# Patient Record
Sex: Female | Born: 1935 | Race: White | Hispanic: No
Health system: Southern US, Community
[De-identification: ages and names within clinical notes are randomized; demographics above are authoritative.]

---

## 2011-10-30 ENCOUNTER — Ambulatory Visit: Payer: Self-pay | Admitting: Pain Medicine

## 2012-04-23 ENCOUNTER — Ambulatory Visit: Payer: Self-pay | Admitting: Internal Medicine

## 2012-07-06 ENCOUNTER — Ambulatory Visit: Payer: Self-pay | Admitting: Internal Medicine

## 2012-07-29 ENCOUNTER — Ambulatory Visit: Payer: Self-pay | Admitting: Internal Medicine

## 2012-08-25 ENCOUNTER — Ambulatory Visit: Payer: Self-pay | Admitting: Specialist

## 2013-01-05 ENCOUNTER — Emergency Department: Payer: Self-pay | Admitting: Emergency Medicine

## 2013-01-12 ENCOUNTER — Ambulatory Visit: Payer: Self-pay | Admitting: Family Medicine

## 2013-05-18 IMAGING — CR DG THORACIC SPINE 2-3V
1 series · 3 of 3 positions shown · non-contrast
Comparison: none

REASON FOR EXAM: generalized myalgias/mid back pain /low back pain
COMMENTS:

[Series 1: t thoracic spine ap · 0.14mm/px · 3 of 3 slices shown]
[im 1/3]
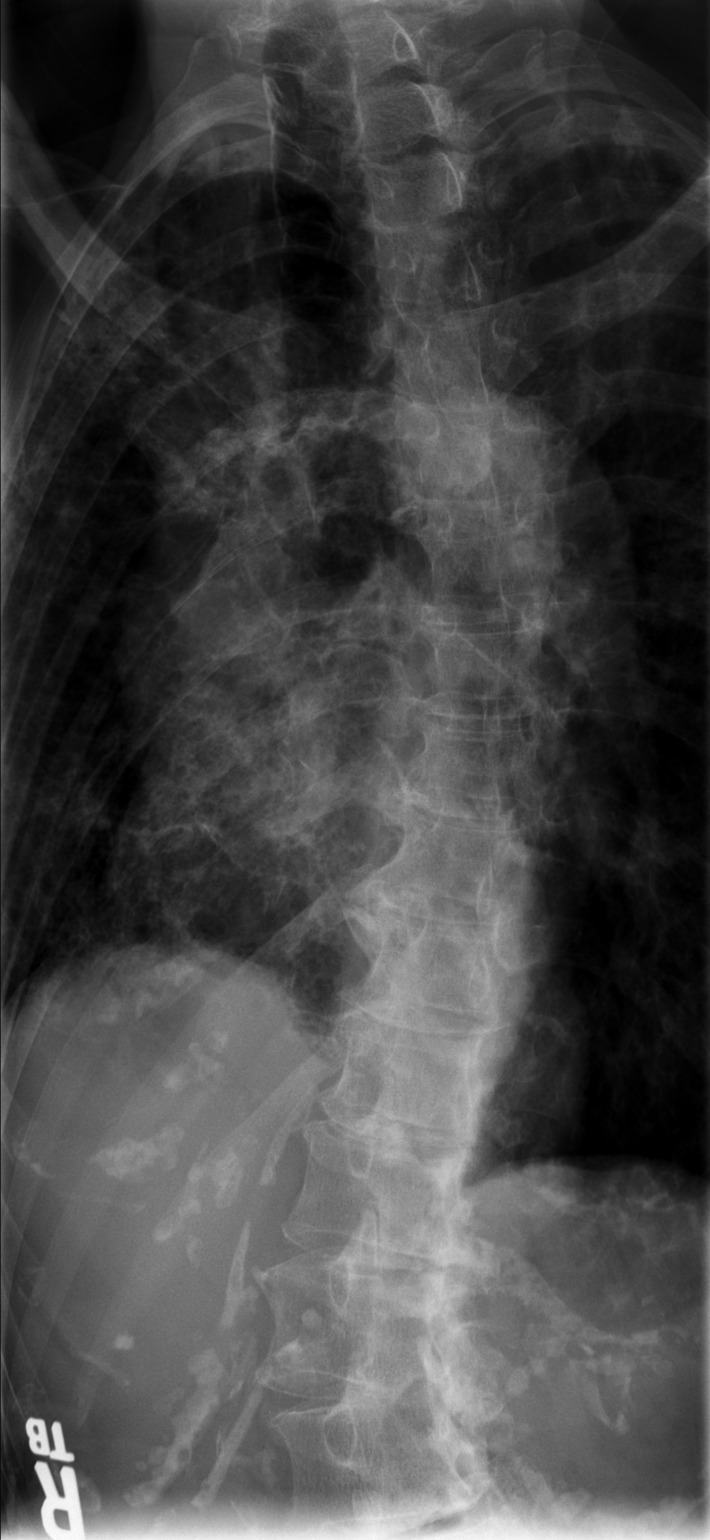
[im 2/3]
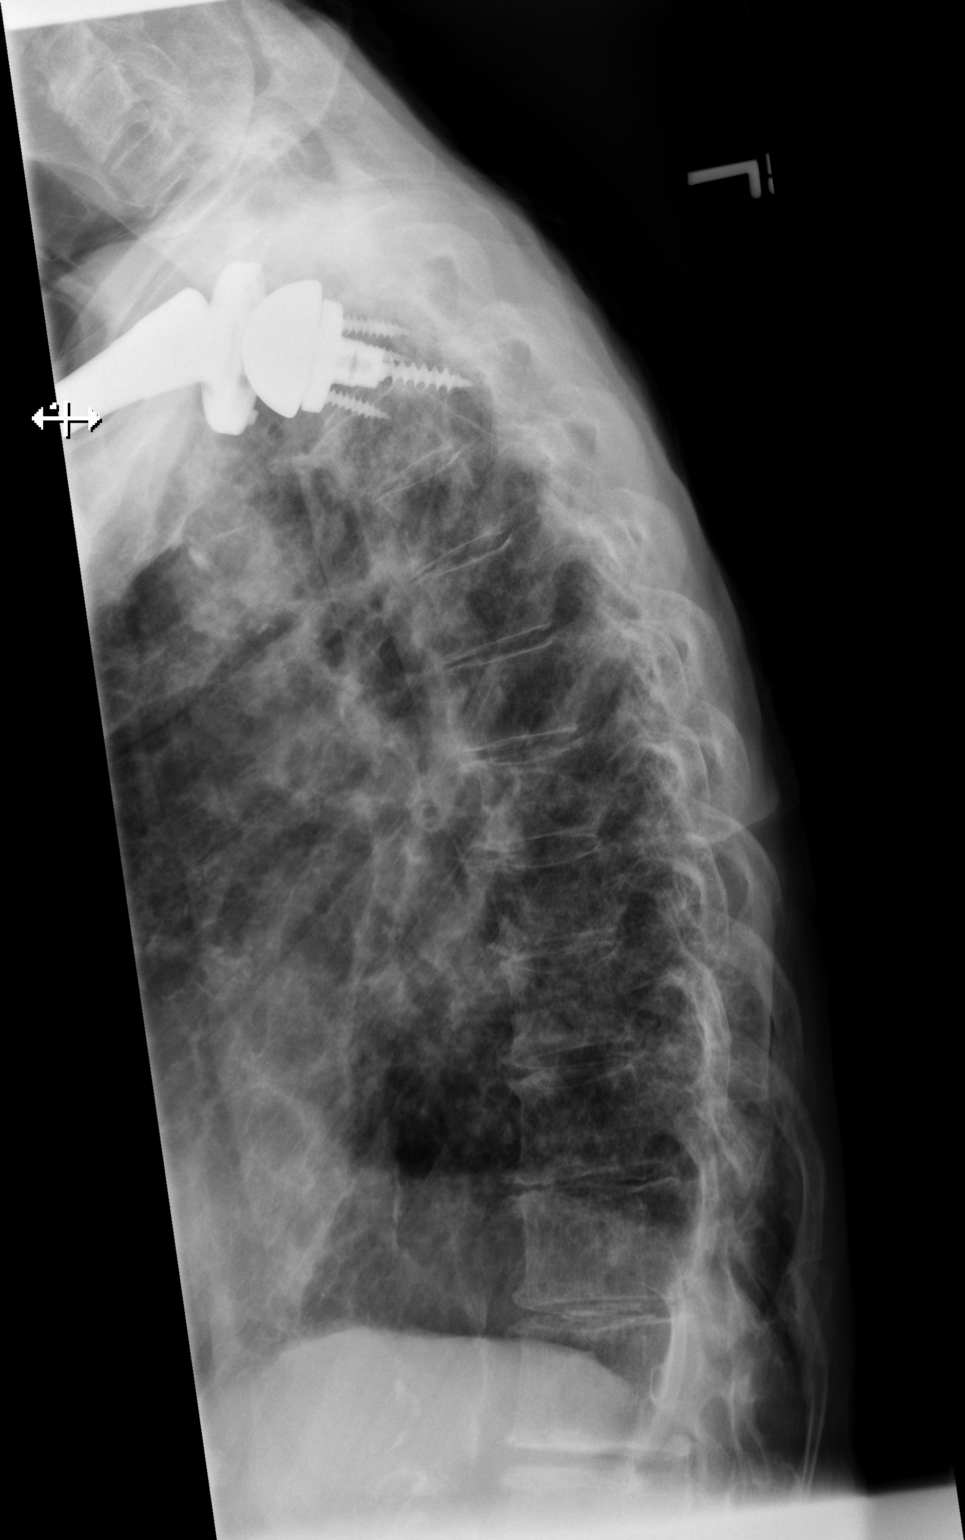
[im 3/3]
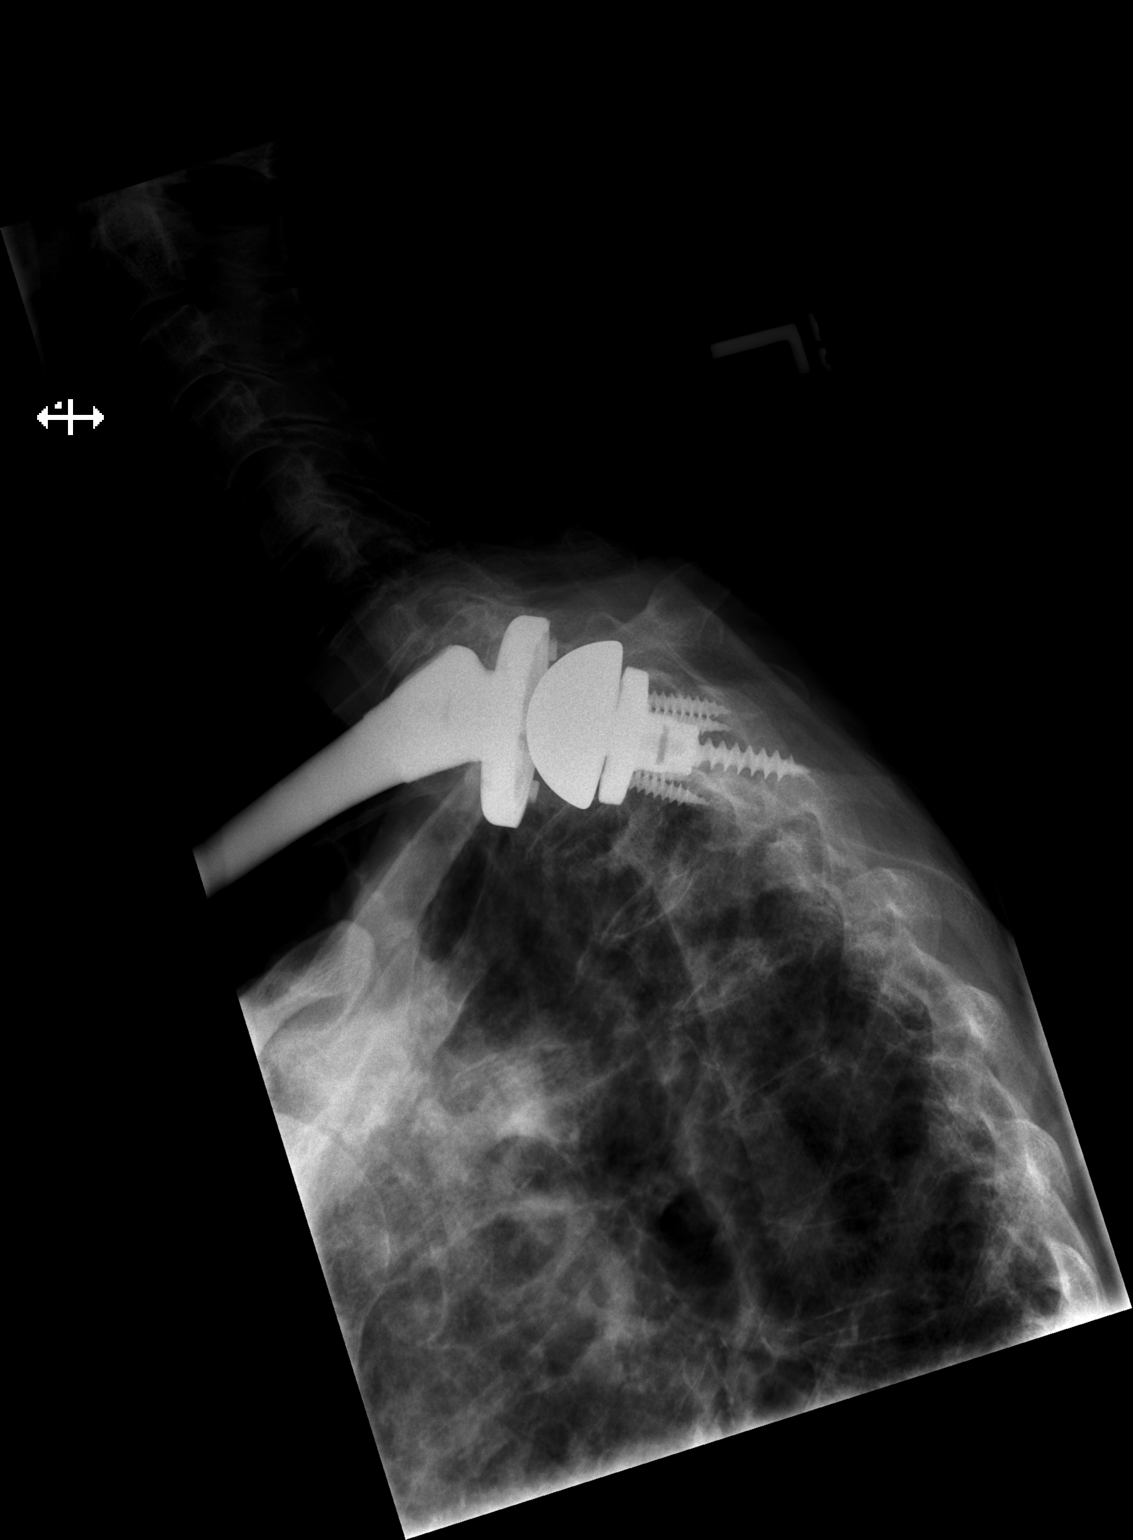

[3 of 3 positions shown; findings below may reference images not displayed]

PROCEDURE:     DXR - DXR THORACIC  AP AND LATERAL  - October 30, 2011  [DATE]

RESULT:     The vertebral body heights are well-maintained. No fracture is
seen. Vertebral body alignment is normal. There is a mild thoracolumbar
scoliosis with the thoracic component having the convexity to the left and
the lumbar component having the convexity to the right. No lytic or blastic
lesions are seen.
IMPRESSION: 1. No acute bony abnormalities are identified.
2. There is a mild thoracolumbar scoliosis as noted above.

## 2013-09-13 ENCOUNTER — Ambulatory Visit: Payer: Self-pay

## 2013-10-21 ENCOUNTER — Ambulatory Visit: Payer: Self-pay | Admitting: Gastroenterology

## 2014-01-19 ENCOUNTER — Ambulatory Visit: Payer: Self-pay | Admitting: Internal Medicine

## 2014-01-30 LAB — CBC
HCT: 38.3 % (ref 35.0–47.0)
HGB: 12.1 g/dL (ref 12.0–16.0)
MCH: 29.1 pg (ref 26.0–34.0)
MCHC: 31.5 g/dL — AB (ref 32.0–36.0)
MCV: 92 fL (ref 80–100)
PLATELETS: 294 10*3/uL (ref 150–440)
RBC: 4.15 10*6/uL (ref 3.80–5.20)
RDW: 13.2 % (ref 11.5–14.5)
WBC: 8.8 10*3/uL (ref 3.6–11.0)

## 2014-01-30 LAB — COMPREHENSIVE METABOLIC PANEL
ALK PHOS: 97 U/L
ALT: 25 U/L (ref 12–78)
AST: 29 U/L (ref 15–37)
Albumin: 2.7 g/dL — ABNORMAL LOW (ref 3.4–5.0)
Anion Gap: 3 — ABNORMAL LOW (ref 7–16)
BILIRUBIN TOTAL: 0.2 mg/dL (ref 0.2–1.0)
BUN: 20 mg/dL — ABNORMAL HIGH (ref 7–18)
CO2: 36 mmol/L — AB (ref 21–32)
Calcium, Total: 8.5 mg/dL (ref 8.5–10.1)
Chloride: 102 mmol/L (ref 98–107)
Creatinine: 0.98 mg/dL (ref 0.60–1.30)
EGFR (African American): >60
EGFR (Non-African Amer.): 54 — ABNORMAL LOW
GLUCOSE: 100 mg/dL — AB (ref 65–99)
Osmolality: 284 (ref 275–301)
Potassium: 4.5 mmol/L (ref 3.5–5.1)
SODIUM: 141 mmol/L (ref 136–145)
Total Protein: 7.3 g/dL (ref 6.4–8.2)

## 2014-01-30 LAB — URINALYSIS, COMPLETE
BACTERIA: NONE SEEN
BILIRUBIN, UR: NEGATIVE
Blood: NEGATIVE
GLUCOSE, UR: NEGATIVE mg/dL (ref 0–75)
KETONE: NEGATIVE
Leukocyte Esterase: NEGATIVE
Nitrite: NEGATIVE
Ph: 5 (ref 4.5–8.0)
Protein: 30
RBC,UR: 5 /HPF (ref 0–5)
Specific Gravity: 1.018 (ref 1.003–1.030)
Squamous Epithelial: NONE SEEN

## 2014-01-30 LAB — CK TOTAL AND CKMB (NOT AT ARMC)
CK, TOTAL: 24 U/L — AB
CK, TOTAL: 19 U/L — AB
CK-MB: 1.8 ng/mL (ref 0.5–3.6)
CK-MB: 1.8 ng/mL (ref 0.5–3.6)

## 2014-01-30 LAB — TSH: Thyroid Stimulating Horm: 3.32 u[IU]/mL

## 2014-01-30 LAB — PRO B NATRIURETIC PEPTIDE: B-TYPE NATIURETIC PEPTID: 12995 pg/mL — AB (ref 0–450)

## 2014-01-30 LAB — TROPONIN I
Troponin-I: 0.04 ng/mL
Troponin-I: 0.04 ng/mL
Troponin-I: 0.04 ng/mL

## 2014-01-30 LAB — MAGNESIUM: Magnesium: 1.8 mg/dL

## 2014-01-31 ENCOUNTER — Inpatient Hospital Stay: Payer: Self-pay | Admitting: Internal Medicine

## 2014-01-31 LAB — BASIC METABOLIC PANEL
ANION GAP: 3 — AB (ref 7–16)
BUN: 19 mg/dL — ABNORMAL HIGH (ref 7–18)
Calcium, Total: 8.2 mg/dL — ABNORMAL LOW (ref 8.5–10.1)
Chloride: 102 mmol/L (ref 98–107)
Co2: 37 mmol/L — ABNORMAL HIGH (ref 21–32)
Creatinine: 1.07 mg/dL (ref 0.60–1.30)
EGFR (African American): 57 — ABNORMAL LOW
EGFR (Non-African Amer.): 49 — ABNORMAL LOW
Glucose: 92 mg/dL (ref 65–99)
Osmolality: 285 (ref 275–301)
Potassium: 4.4 mmol/L (ref 3.5–5.1)
SODIUM: 142 mmol/L (ref 136–145)

## 2014-01-31 LAB — CBC WITH DIFFERENTIAL/PLATELET
BASOS PCT: 0.5 %
Basophil #: 0 10*3/uL (ref 0.0–0.1)
Eosinophil #: 0.1 10*3/uL (ref 0.0–0.7)
Eosinophil %: 1.3 %
HCT: 38.6 % (ref 35.0–47.0)
HGB: 12.3 g/dL (ref 12.0–16.0)
LYMPHS PCT: 13.4 %
Lymphocyte #: 1 10*3/uL (ref 1.0–3.6)
MCH: 30.2 pg (ref 26.0–34.0)
MCHC: 31.9 g/dL — AB (ref 32.0–36.0)
MCV: 95 fL (ref 80–100)
Monocyte #: 0.5 x10 3/mm (ref 0.2–0.9)
Monocyte %: 6.1 %
Neutrophil #: 6.2 10*3/uL (ref 1.4–6.5)
Neutrophil %: 78.7 %
Platelet: 258 10*3/uL (ref 150–440)
RBC: 4.08 10*6/uL (ref 3.80–5.20)
RDW: 13.7 % (ref 11.5–14.5)
WBC: 7.8 10*3/uL (ref 3.6–11.0)

## 2014-02-02 LAB — CBC
HCT: 32.7 % — AB (ref 35.0–47.0)
HGB: 9.9 g/dL — AB (ref 12.0–16.0)
MCH: 28.7 pg (ref 26.0–34.0)
MCHC: 30.4 g/dL — AB (ref 32.0–36.0)
MCV: 94 fL (ref 80–100)
PLATELETS: 180 10*3/uL (ref 150–440)
RBC: 3.46 10*6/uL — ABNORMAL LOW (ref 3.80–5.20)
RDW: 13.5 % (ref 11.5–14.5)
WBC: 9.7 10*3/uL (ref 3.6–11.0)

## 2014-02-02 LAB — TROPONIN I: TROPONIN-I: 0.14 ng/mL — AB

## 2014-02-02 LAB — COMPREHENSIVE METABOLIC PANEL
Albumin: 2.4 g/dL — ABNORMAL LOW (ref 3.4–5.0)
Alkaline Phosphatase: 91 U/L
Anion Gap: 5 — ABNORMAL LOW (ref 7–16)
BUN: 32 mg/dL — AB (ref 7–18)
Bilirubin,Total: 0.2 mg/dL (ref 0.2–1.0)
CALCIUM: 8.1 mg/dL — AB (ref 8.5–10.1)
CO2: 34 mmol/L — AB (ref 21–32)
Chloride: 105 mmol/L (ref 98–107)
Creatinine: 1.96 mg/dL — ABNORMAL HIGH (ref 0.60–1.30)
GFR CALC AF AMER: 27 — AB
GFR CALC NON AF AMER: 24 — AB
Glucose: 82 mg/dL (ref 65–99)
OSMOLALITY: 293 (ref 275–301)
Potassium: 4.5 mmol/L (ref 3.5–5.1)
SGOT(AST): 42 U/L — ABNORMAL HIGH (ref 15–37)
SGPT (ALT): 25 U/L (ref 12–78)
Sodium: 144 mmol/L (ref 136–145)
Total Protein: 6.3 g/dL — ABNORMAL LOW (ref 6.4–8.2)

## 2014-02-02 LAB — CK TOTAL AND CKMB (NOT AT ARMC)
CK, Total: 398 U/L — ABNORMAL HIGH
CK-MB: 3.4 ng/mL (ref 0.5–3.6)

## 2014-02-03 ENCOUNTER — Inpatient Hospital Stay: Payer: Self-pay | Admitting: Internal Medicine

## 2014-02-03 LAB — LIPID PANEL
Cholesterol: 137 mg/dL (ref 0–200)
HDL: 56 mg/dL (ref 40–60)
LDL CHOLESTEROL, CALC: 62 mg/dL (ref 0–100)
Triglycerides: 93 mg/dL (ref 0–200)
VLDL Cholesterol, Calc: 19 mg/dL (ref 5–40)

## 2014-02-03 LAB — BASIC METABOLIC PANEL
Anion Gap: 8 (ref 7–16)
BUN: 31 mg/dL — ABNORMAL HIGH (ref 7–18)
CALCIUM: 7.8 mg/dL — AB (ref 8.5–10.1)
CHLORIDE: 110 mmol/L — AB (ref 98–107)
CREATININE: 1.73 mg/dL — AB (ref 0.60–1.30)
Co2: 26 mmol/L (ref 21–32)
EGFR (Non-African Amer.): 27 — ABNORMAL LOW
GFR CALC AF AMER: 32 — AB
Glucose: 82 mg/dL (ref 65–99)
OSMOLALITY: 292 (ref 275–301)
POTASSIUM: 4.4 mmol/L (ref 3.5–5.1)
Sodium: 144 mmol/L (ref 136–145)

## 2014-02-03 LAB — CK-MB
CK-MB: 3.1 ng/mL (ref 0.5–3.6)
CK-MB: 2.9 ng/mL (ref 0.5–3.6)

## 2014-02-03 LAB — TROPONIN I
TROPONIN-I: 0.12 ng/mL — AB
Troponin-I: 0.16 ng/mL — ABNORMAL HIGH

## 2014-02-15 IMAGING — CT CT CHEST W/ CM
1 series · 15 of 32 positions shown, 19 images · IV contrast (agent unspecified)
Comparison: none

REASON FOR EXAM: Abn Chest Xray
COMMENTS:

PROCEDURE:     CIUNIA - CIUNIA CHEST WITH CONTRAST  - July 29, 2012  [DATE]
RESULT:
TECHNIQUE: Chest CT with 75 ml of Hsovue-7KK iodinated intravenous contrast
is reconstructed at 3 mm slice thickness in the axial plane.
There is no previous exam for comparison.

[Series 2: soft tissue · axial · 0.54mm/px · z∈[-309,+24]mm · 15 of 126 slices shown, 19 images]
[im 10/126  mediastinal]
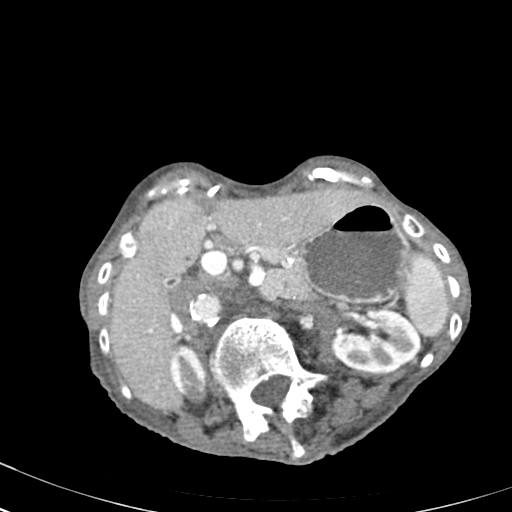
[im 10/126  lung]
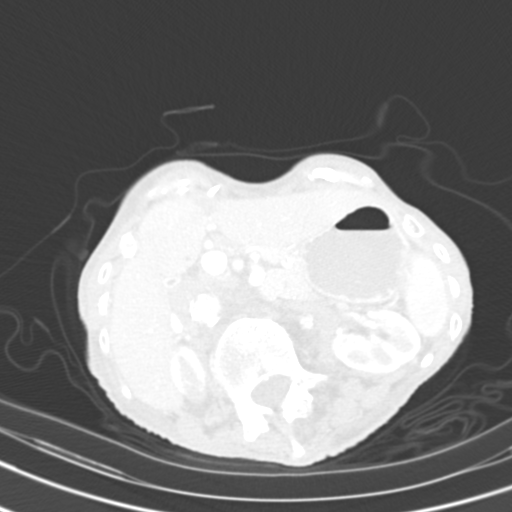
[im 19/126  lung]
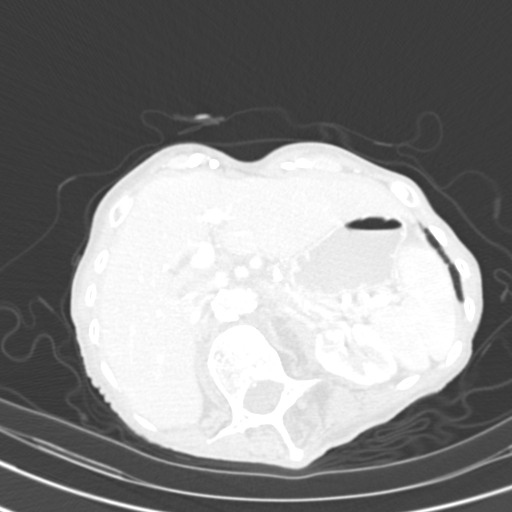
[im 26/126  lung]
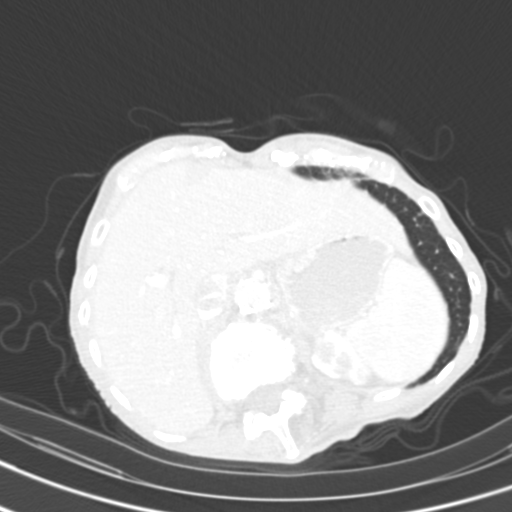
[im 33/126  lung]
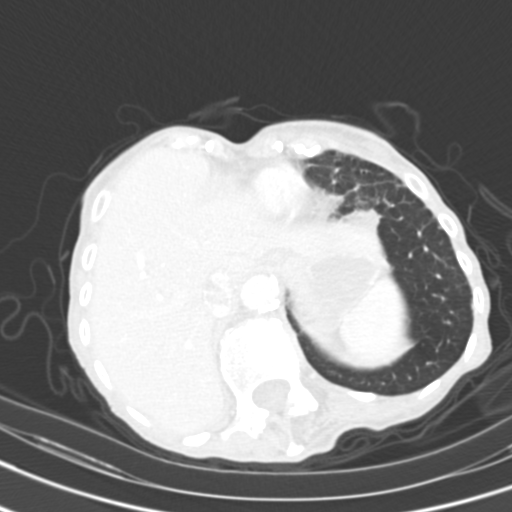
[im 42/126  mediastinal]
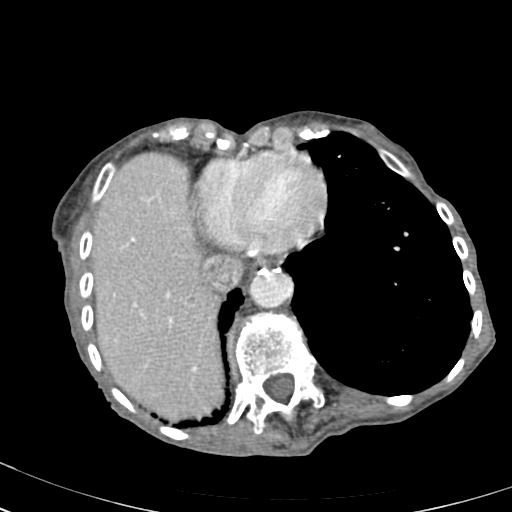
[im 42/126  lung]
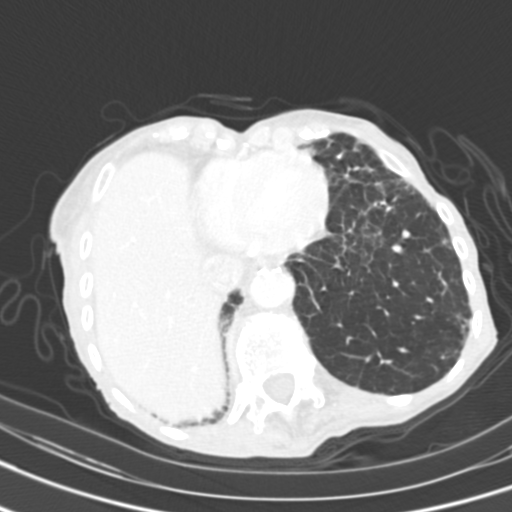
[im 51/126  lung]
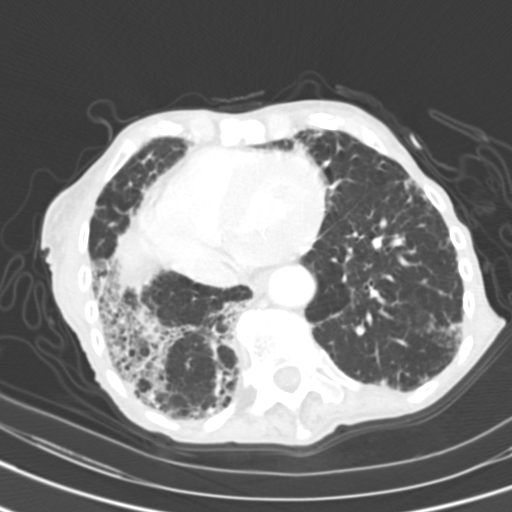
[im 61/126  lung]
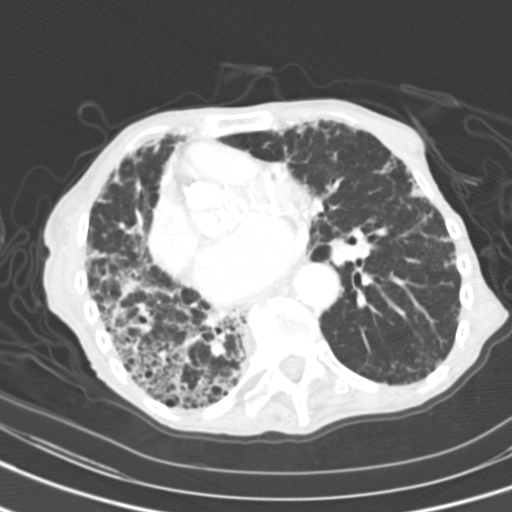
[im 66/126  lung]
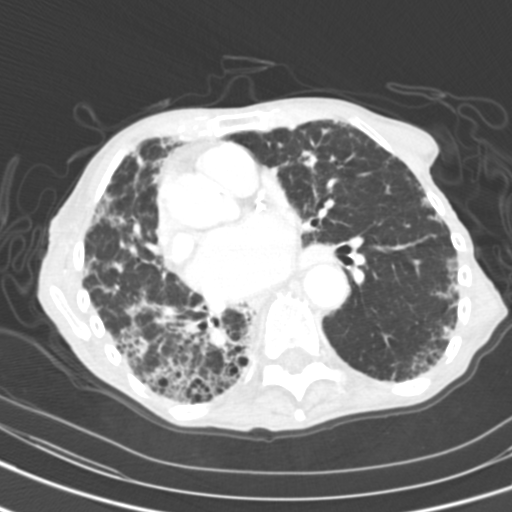
[im 75/126  mediastinal]
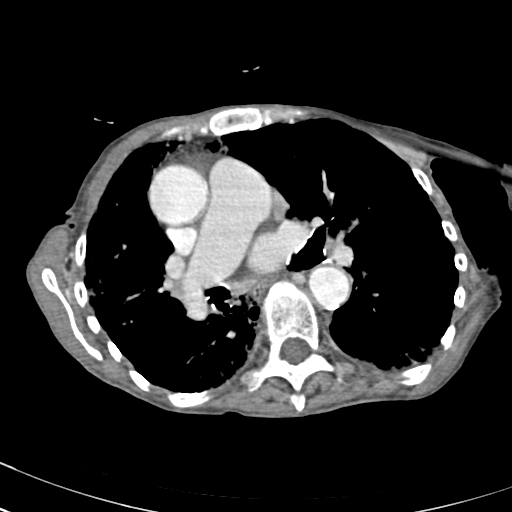
[im 75/126  lung]
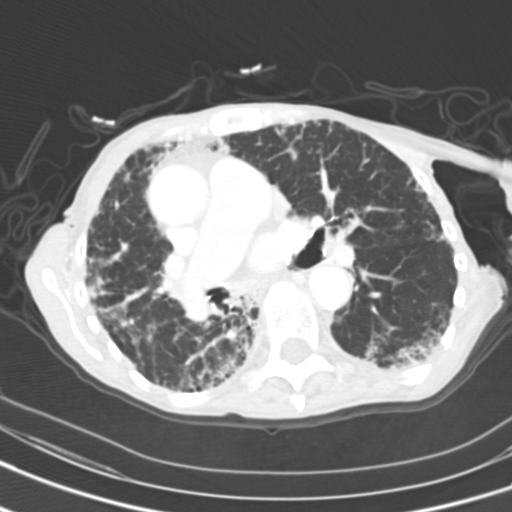
[im 79/126  lung]
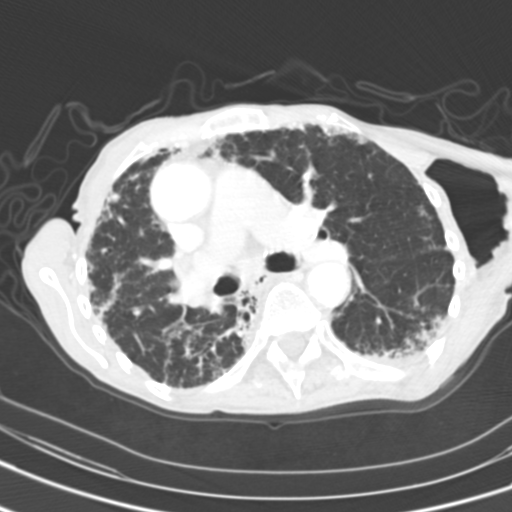
[im 88/126  lung]
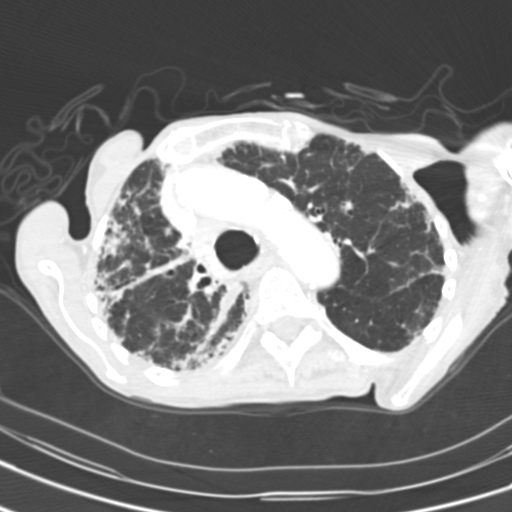
[im 98/126  lung]
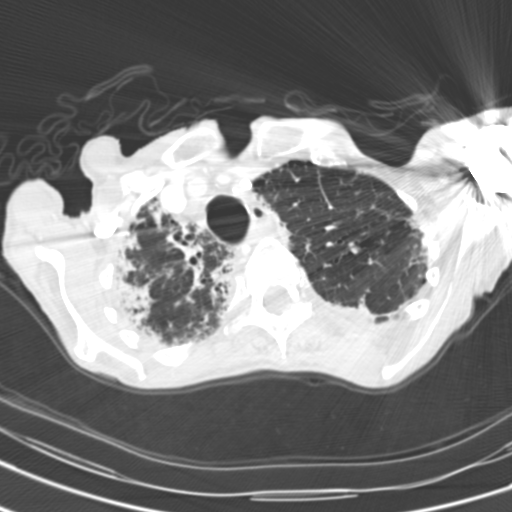
[im 102/126  mediastinal]
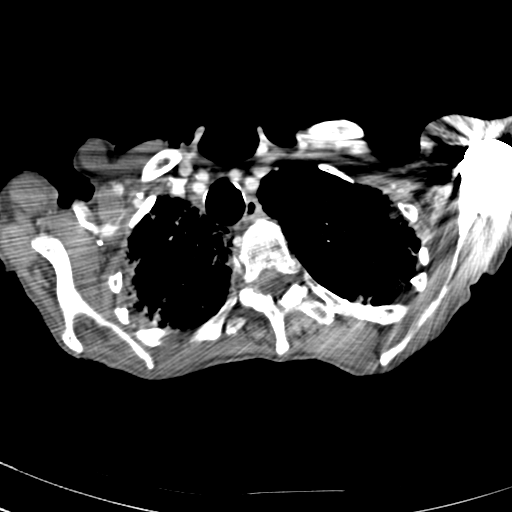
[im 102/126  lung]
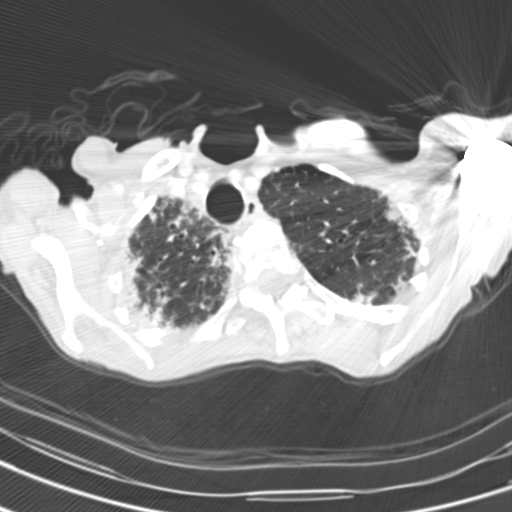
[im 112/126  lung]
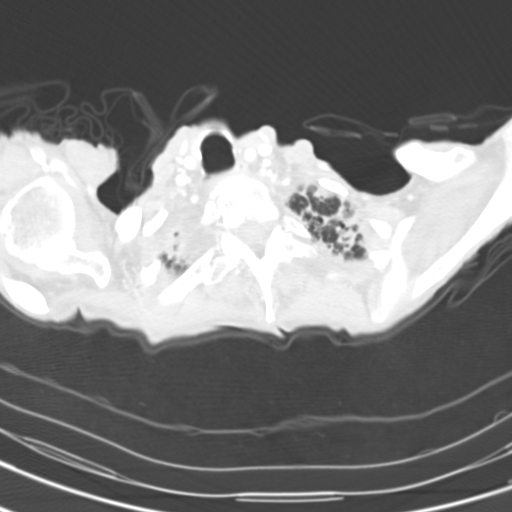
[im 121/126  lung]
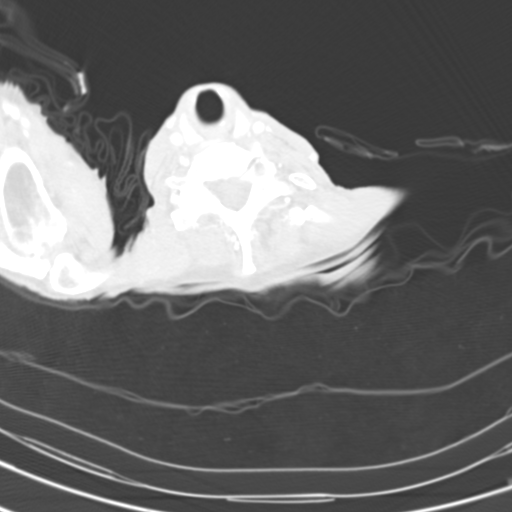

[15 of 32 positions shown; findings below may reference images not displayed]

FINDINGS: There are extensive areas of abnormality throughout both lungs
with significant areas of peribronchial thickening and septal thickening
fairly diffusely worse on the right. There is bronchiectasis present in the
right upper lobe with early bronchiectatic changes in the right lower lobe.
No definite consolidation or effusion is present. There is no pneumothorax.
Some scattered bullous regions are present. There is evidence of volume loss
in the right hemithorax with shift of the mediastinum toward the right. The
heart is not enlarged. The included upper abdominal structures appear to be
within normal limits. There is a rotoscoliosis, concave to the right, in the
mid thoracic region.
IMPRESSION: Severe chronic emphysematous changes with findings of
bronchiectasis, chronic bronchitis and possibly some underlying pneumonitis.
Edema is felt to be less likely. Malignancy is not excluded given the
extensive changes in the lungs diffusely with reticular and nodular
densities. Pulmonology consultation and follow-up is recommended.

[REDACTED]

## 2014-02-19 ENCOUNTER — Ambulatory Visit: Payer: Self-pay | Admitting: Internal Medicine

## 2014-02-19 DEATH — deceased

## 2014-11-12 NOTE — H&P (Signed)
PATIENT NAME:  Katelyn Schwartz, Katelyn Schwartz MR#:  [id number] DATE OF BIRTH:  1932-05-22  DATE OF ADMISSION:  02/03/2014  PRIMARY CARE PHYSICIAN:  Is at [facility].   REFERRING EMERGENCY ROOM PHYSICIAN: Dr. Daryel November  CHIEF COMPLAINT: Worsening of altered mental status.   HISTORY OF PRESENT ILLNESS: The patient is an 81 year old Caucasian female who was admitted and just discharged from the hospital yesterday afternoon and was sent over to [facility] skilled nursing facility. According to daughter, patient was found to be more confused in the skilled nursing facility and she was not taking any p.o.  As patient's overall condition is getting worse, she is sent over to the ED. Also, patient was noticed to have atrial fibrillation with RVR. The patient was discharged from the hospital after being admitted for pneumonia and treated for pneumonia. Daughter and daughter-in-law were at bedside and they are really concerned if the patient's condition is deteriorating. Actually, they are reporting that patient has appointment with hospice tomorrow.  In the ED, the patient had a CAT scan of the head without contrast, which has revealed no acute findings. Chest x-ray has revealed congestive heart failure superimposed on emphysematous changes. He also has reported that a degree of superimposed pneumonia in the base cannot be excluded radiographically. The patient initially was atrial fibrillation with RVR at 124 beats per minute.  After 1 liter fluid bolus, the patient's heart rate is significantly improved. The patient's troponin is elevated at 0.14 which was at 0.04 on July 12. Hemoglobin is at 9.9, which trended down from 12.3 on July 13. Creatinine went up from 1.07 to 1.96. As patient also has bright red blood per rectum, we have been unable to give her any aspirin or any anticoagulation. During my examination, the patient is very restless and altered. Daughter has requested to give her Restoril 50 mg for  insomnia, and if it is not helping, she has requested to opt for sitters to monitor her closely. I was unable to get any history from the patient as she is with altered mental status. Her daughter is also worried about her elevated troponin, which could be from heart attack. Patient was treated for atrial fibrillation with RVR, pneumonia, and also for chest pain. Regarding chest pain, she was evaluated by Dr. Juliann Pares who thought it was not acute coronary syndrome during the previous admission.   PAST MEDICAL HISTORY: Alzheimer dementia, chronic atrial fibrillation, benign hypertension, stress urinary incontinence, protein calorie malnutrition, chronic anxiety, chronic insomnia, and COPD.   PAST SURGICAL HISTORY: Bilateral knee surgery, hysterectomy.   ALLERGIES:  PATIENT IS ALLERGIC TO BEXTRA, CODEINE, MORPHINE, PENICILLIN, SULFA.   HOME MEDICATIONS: Temazepam 15 mg 1 capsule p.o. once daily, oxybutynin 1 tablet p.o. once a day, olanzapine 2.5 mg p.o. b.i.d., levofloxacin 750 mg p.o. q. 48 hours, Ensure Plus liquid 237 mL p.o. 3 times a day, enalapril 10 mg p.o. once daily, aspirin 81 mg p.o. once a day.   SOCIAL HISTORY: Negative for alcohol or tobacco use.   FAMILY HISTORY: Unknown.   REVIEW OF SYSTEMS:  Unobtainable.   PHYSICAL EXAMINATION:  VITAL SIGNS: Temperature 100, pulse 90, respirations 21, blood pressure is 133/63, pulse oximetry is at 95%.  GENERAL APPEARANCE: Not in acute distress, but she is with altered mental status. The patient looks cachectic.  HEENT: Normocephalic, atraumatic. Pupils are equal, reacting to light and accommodation. Extraocular movements are intact. Sclerae anicteric.  Conjunctivae are clear.  NECK: Supple with no JVD. No adenopathy,  no thyromegaly.  Patient is spontaneously turning her neck to the sides.  LUNGS: Decreased breath sounds at the bases. Positive rales. No accessory muscle usage.  CARDIOVASCULAR: Irregularly irregular with RVR, 3/6 murmur was  noticed for bradycardia.  GASTROINTESTINAL: Soft, nontender, nondistended.  Normoactive bowel sounds. No organomegaly. No masses were appreciated.  EXTREMITIES: No cyanosis. No clubbing. Pulses are 2+.  Dorsalis pedis is intact.  SKIN: Warm to touch. Dry in nature without any rashes or lesions. NEUROLOGICAL: Awake and alert, but disoriented. Reflexes are 2+. Spontaneously moving her extremities.  PSYCHIATRIC: This patient is disoriented. I could not elicit her mood and affect.   LABORATORY AND IMAGING STUDIES: A 12-lead EKG has revealed atrial fibrillation with RVR at 124.  No acute ST-T wave changes. WBC 9.2; hemoglobin 9.9, which was at 12.3 on July 13; hematocrit 32.7, platelets are 180,000. CK total 398, CPK-MB 3.4. Troponin trended up to 0.14 and the previous troponin on July 12 was at 0.04. LFTs: Total protein 6.3, albumin 2.4. Bilirubin total and alkaline phosphatase are normal. ALT 42, AST normal. BMP has revealed glucose at 82, BUN 32, creatinine 1.96, sodium 144, potassium 4.5, chloride 105, CO2 of 34, GFR is 24, anion gap is 5, serum osmolality  normal, calcium at 8.1. Portable chest x-ray has revealed congestive heart failure superimposed on emphysematous change and pulmonary fibrotic changes. Under appears to be slightly more opacity in the bases, probably reflecting slight increase in the edema.  A degree of superimposed pneumonia in the bases cannot be excluded radiographically. CT head with no acute findings.   ASSESSMENT AND PLAN: An 81 year old female who was just discharged from the hospital to skilled nursing facility yesterday after getting treatment for atrial fibrillation with rapid ventricular response pneumonia and chest pain is brought into the Emergency Department with worsening of her mentation and bright red blood per rectum with decreased p.o. intake.  1. Altered mental status, probably from acute kidney injury from decreased p.o. intake and probably non-STEMI versus demand  ischemia. We will admit her to telemetry. Cycle cardiac biomarkers. Keep her n.p.o.  Provide IV fluids.  Cardiology consult is placed to Dr. Juliann Paresallwood.  We will cycle cardiac biomarkers. As the patient has bright red blood per rectum, I cannot give her aspirin or any other anticoagulants. We will provide her intravenous beta blocker if systolic blood pressure is elevated and if heart rate is greater than 150. Altered mental status can be from worsening of pneumonia. We will provide her broad-spectrum antibiotics.  2. Acute kidney injury, probably from decreased p.o. intake. We will provide her IV fluids.  3. Acute on chronic atrial fibrillation with rapid ventricular response. The patient is not on any anticoagulants. We will provide her Lopressor IV as needed basis for uncontrolled heart rate.  4. Lower gastrointestinal bleed with bright red blood per rectum. Monitor hemoglobin and hematocrit closely. We will type and screen her blood, and if necessary, we will provide blood transfusion. Gastroenterology consult is placed and we will provide IV Protonix.  5. Insomnia. We will provide her Restoril 50 mg as requested by the daughter as it worked in the past. If patient does not fall asleep, we will consider sitter to monitor the patient closely.  6. Chronic history of dementia.   We will resume her dementia medications.  7. We will provide gastrointestinal prophylaxis and deep vein thrombosis prophylaxis with sequential compression devices.   CODE STATUS: She is do not resuscitate.   Palliative consult is placed by the  ER physician. Daughter is the medical power of attorney. Condition is guarded.  CRITICAL CARE TIME SPENT: 50 minutes.     ____________________________ Ramonita Lab, MD ag:dd D: 02/03/2014 01:51:01 ET T: 02/03/2014 02:44:55 ET JOB#: [id number]  cc: Ramonita Lab, MD, <Dictator> Ramonita Lab MD ELECTRONICALLY SIGNED 2014/02/20 2:34

## 2014-11-12 NOTE — Consult Note (Signed)
   Comments   I met with pt's daughter and son. Family confirm general decline over the past few weeks. Both seem to recognize that decline may continue and that patient could be approaching end of life. They are in agreement with hospice at the facility. Daughter asks about the Hospice Home which may also be a possibility should patient further decline. They would like to speak with the hospice liaison. Will coordinate via SW.   Electronic Signatures: Borders, Kirt Boys (NP)  (Signed 16-Jul-15 13:30)  Authored: Palliative Care   Last Updated: 16-Jul-15 13:30 by Irean Hong (NP)

## 2014-11-12 NOTE — Discharge Summary (Signed)
PATIENT NAME:  Katelyn Schwartz, LUNT MR#:  [id number] DATE OF BIRTH:  1932/05/14  DATE OF ADMISSION:  02/03/2014 DATE OF DISCHARGE:  02/04/2014  ADMITTING DIAGNOSIS: Gastrointestinal bleed.   DISCHARGE DIAGNOSES:  1. Atrial fibrillation, rapid ventricular response. 2. Elevated troponin, likely demand ischemia. 3. Acute diastolic congestive heart failure. 4. History of moderate aortic stenosis. 5. Acute renal failure. 6. Dementia with agitation. 7. Obstructive pneumonia. 8. Failure to thrive, adult with severe protein calorie malnutrition and body mass index of 14.5. 9. Chronic obstructive pulmonary disease, severe bullous emphysema. 10. Obstructive chronic respiratory failure. 11. Urinary incontinence.  DISCHARGE CONDITION: Poor.  HOME OXYGEN: The patient is being discharged on oxygen, 2 liters oxygen per nasal cannula as needed.  ACTIVITY LIMITATIONS: As tolerated.  DIET: As tolerated.   She is being discharge to hospice home.   DISCHARGE MEDICATIONS: Are as follows:  1. Temazepam 15 mg p.o. at bedtime.  2. Oxybutynin extended-release 10 mg p.o. daily.  3. Ensure Plus 237 mL 3 times daily with meals.  4. Levofloxacin 750 mg p.o. every 48 hours for 2 more pills. 5. Olanzapine 2.5 mg every 8 hours.  6. Tylenol 650 mg suppository every 4 hours as needed. 7. Morphine 20 mg in 1 mL oral concentrate 0.25 mL every 1 hour as needed. 8. Metoprolol 25 mg twice daily as needed.  9. Lorazepam 1 mg twice daily as needed.  The patient is not to take donepezil or aspirin.   FOLLOWUP: No followup appointments with physicians were recommended.   CONSULTANTS: Care management, social work, Dr. Paulina Fusi, palliative care.   RADIOLOGIC STUDIES: Chest x-ray, portable single view, 02/03/2014, showed congestive heart failure superimposed on emphysematous change and pulmonary fibrotic change which appears to be slightly more opacified in the bases, probably reflecting slight increase in edema. The  degree of superimposed pneumonia in the bases cannot be excluded radiographically, however. CT scan of the head without contrast 02/03/2014 showed no acute intracranial findings, chronic and senescent changes were noted.   HOSPITAL COURSE: The patient is an 81 year old Caucasian female who was recently admitted to the hospital for evaluation for atrial fibrillation, RVR, was noted to be in paroxysmal atrial fibrillation as well as was diagnosed with severe bullous emphysema as well as moderate aortic stenosis and was discharged to facility on aspirin therapy for history of atrial fibrillation presents back to the hospital next day after discharge with recurrent altered mental status. Apparently, more patient was more confused and was not eating in the facility and was brought to the hospital next day after discharge on 02/03/2014 and was admitted. Moreover, she was noted to have rectal bleeding. Her heart rate was up to 130s to 140s on admission to the Emergency Room and her troponin was found to be elevated at 0.14-0.16. She was also noted to be in acute renal failure with creatinine of 1.96 on 02/03/2014 leaving just 1 day earlier with normal creatinine. She was admitted to the hospital for further evaluation and initiated on metoprolol for heart rate control as well as some IV fluids for acute renal failure. She was consulted by cardiologist as well as palliative care and patient's family after discussion decided about hospice home admission and comfort care. She is being discharged in poor condition with the above-mentioned medications and no followup. On the day of discharge, vital signs were checked last on 02/03/2014. At that time, she was afebrile with temperature of 98.1, pulse was 85, respiratory rate was 18, blood pressure 151/70, saturation was 97%  on 2 liters of oxygen through nasal cannula at rest.   TIME SPENT: 40 minutes.    ____________________________ Katharina Caperima Michelangelo Rindfleisch, MD rv:lt D: 02/04/2014  21:05:25 ET T: 03/15/2014 06:55:46 ET JOB#: [id number]  cc: Katharina Caperima Shamiyah Ngu, MD, <Dictator> Micheala Morissette MD ELECTRONICALLY SIGNED 02/25/2014 15:17

## 2014-11-12 NOTE — Consult Note (Signed)
Details:   - I have seen and examined Katelyn Schwartz and agree with Wilhelmenia BlaseKaryn Earle's a/p.   She would be a poor candidate for colonoscopy or anesthesia given her poor functional status and dementia.   Agree with comfort measures.   Electronic Signatures: Dow Adolphein, Matthew (MD)  (Signed 16-Jul-15 17:18)  Authored: Details   Last Updated: 16-Jul-15 17:18 by Dow Adolphein, Matthew (MD)

## 2014-11-12 NOTE — Consult Note (Signed)
   Comments   Came by to speak with family. There are two sons here but they are waiting on their sister. Will return later.   Electronic Signatures: Borders, Daryl EasternJoshua R (NP)  (Signed 16-Jul-15 11:32)  Authored: Palliative Care   Last Updated: 16-Jul-15 11:32 by Malachy MoanBorders, Joshua R (NP)

## 2014-11-12 NOTE — H&P (Signed)
PATIENT NAME:  Katelyn Schwartz, Katelyn Schwartz MR#:  [id number] DATE OF BIRTH:  1932/01/16  DATE OF ADMISSION:  01/30/2014  REFERRING PHYSICIAN: Dr. Margarita GrizzleWoodruff.   FAMILY PHYSICIAN[facility]   REASON FOR ADMISSION: Rapid atrial fibrillation of new onset.   HISTORY OF PRESENT ILLNESS: The patient is an 81 year old female [facility]. The patient had a significant history of dementia, peripheral vascular disease, osteoarthritis and hyperlipidemia. Also has underlying lung disease of which the specifics we do not know. Earlier today, the patient [facility]. No improvement with sublingual nitroglycerin there. She was brought to the Emergency Room where she was found to be in rapid atrial fibrillation and mildly hypotensive. She was given IV diltiazem bolus x 1 with improvement of her rate and symptoms. She is currently chest pain-free. Chest x-ray showed just congestive heart failure. Her troponin is normal. She is now admitted for further evaluation.   PAST MEDICAL HISTORY: 1.  Alzheimer's dementia.  2.  Benign hypertension.  3.  Stress urinary incontinence.  4.  Protein-calorie malnutrition.  5.  Chronic anxiety.  6.  Chronic insomnia.  7.  Chronic obstructive pulmonary disease.   MEDICATIONS: 1.  Restoril 15 mg p.o. at bedtime.  2.  Oxybutynin ER 10 mg p.o. daily.  3.  Aricept 10 mg p.o. at bedtime.  4.  Oxygen at 2 liters per minute per nasal cannula.   ALLERGIES: BEXTRA, CODEINE, MORPHINE, PENICILLIN AND SULFA.   SOCIAL HISTORY: Negative for alcohol or tobacco abuse.   FAMILY HISTORY: Unknown.   REVIEW OF SYSTEMS: Unable to obtain.   PHYSICAL EXAMINATION: GENERAL: The patient is confused, in no acute distress.  VITAL SIGNS: Currently remarkable for a blood pressure of 140/70 with a heart rate of 106, respiratory rate of 20, temperature of 97.4, sat 95% on 2 liters.  HEENT: Normocephalic, atraumatic. Pupils equally round  and reactive to light and accommodation. Extraocular movements are intact. Sclerae are anicteric. Conjunctivae are clear. Oropharynx clear.   NECK: Supple without JVD. No adenopathy or thyromegaly is noted.  LUNGS: Reveal basilar crackles without wheezes or rales. No dullness. Respiratory effort is normal.  CARDIAC: Irregularly irregular rhythm. There is a 2/6 murmur noted throughout the precordium. No rubs or gallops are present.  ABDOMEN: Soft, nontender, with normoactive bowel sounds. No organomegaly or masses were appreciated. No hernias or bruits were noted.  EXTREMITIES: Without clubbing, cyanosis, edema. Pulses were 2+ bilaterally.  SKIN: Warm and dry without rash or lesions.  NEUROLOGIC: Cranial nerves II through XII grossly intact. Deep tendon reflexes were symmetric. Motor and sensory exam is nonfocal.  PSYCHIATRIC: Revealed a patient who is alert but disoriented to person, place and time.   LABORATORY, DIAGNOSTIC AND RADIOLOGICAL DATA:  EKG revealed atrial fibrillation at 90 beats per minute with no acute ischemic changes. Chest x-ray revealed pulmonary fibrosis and cardiomegaly. Laboratory data remarkable for a BNP of 12,995. Glucose 100 with a BUN of 20, creatinine of 0.98, GFR of 54. Magnesium was 1.8. TSH 3.32. Troponin 0.04. White count is 8.8 with a hemoglobin of 12.1.   ASSESSMENT: 1.  Rapid atrial fibrillation of new onset.  2.  Mild acute systolic congestive heart failure.  3.  Chest pain of unclear significance.  4.  Alzheimer's dementia.  5.  Peripheral vascular disease.  6.  Cardiac murmur.   PLAN: The patient will be admitted under observation status to telemetry as a full code per her  paperwork from [facility]. She will be placed on Lovenox, aspirin and p.o. Cardizem. We will follow serial cardiac enzymes and obtain an echocardiogram. Will consult cardiology. Continue her outpatient regimen. Wean off oxygen as tolerated. Followup chest x-ray in the morning. Further  treatment and evaluation will depend upon the patient's progress.   TOTAL TIME SPENT ON THIS PATIENT: 45 minutes.    ____________________________ Duane Lope Judithann Sheen, MD jds:cs D: 01/30/2014 15:07:00 ET T: 01/30/2014 15:51:45 ET JOB#: [id number]  cc: Duane Lope. Judithann Sheen, MD, <Dictator> Radha Coggins Rodena Medin MD ELECTRONICALLY SIGNED 01/30/2014 16:47

## 2014-11-12 NOTE — Consult Note (Signed)
Chief Complaint:  Subjective/Chief Complaint Pt confuses and agitated lying bed. She is asking to go home.   VITAL SIGNS/ANCILLARY NOTES: **Vital Signs.:   14-Jul-15 12:41  Vital Signs Type Routine  Temperature Temperature (F) 97.3  Celsius 36.2  Temperature Source oral  Pulse Pulse 81  Respirations Respirations 18  Systolic BP Systolic BP 914  Diastolic BP (mmHg) Diastolic BP (mmHg) 59  Mean BP 89  Pulse Ox % Pulse Ox % 79  Pulse Ox Activity Level  At rest  Oxygen Delivery 3L  *Intake and Output.:   14-Jul-15 05:31  Grand Totals Intake:   Output:      Net:   15 Hr.:  425  Weight Type daily  Weight Method Bed  Current Weight (lbs) (lbs) 97.4  Current Weight (kg) (kg) 44.1  Height (ft) (feet) 5  Height (in) (in) 10  Height (cm) centimeters 177.8  BSA (m2) 1.5  BMI (kg/m2) 13.9   Brief Assessment:  GEN well developed, well nourished, no acute distress, disheveled   Cardiac Irregular  -- LE edema  --Rub   Respiratory normal resp effort  clear BS   Gastrointestinal Normal   Gastrointestinal details normal Soft  Nontender  Nondistended   EXTR negative cyanosis/clubbing, negative edema   Lab Results: LabObservation:  13-Jul-15 10:24   OBSERVATION Reason for Test  Lab:  13-Jul-15 04:30   O2 Saturation (Pulse Ox) 88  FiO2 (Pulse Ox) 0.21  Cardiology:  13-Jul-15 10:24   Echo Doppler REASON FOR EXAM:     COMMENTS:     PROCEDURE: [facility] - ECHO DOPPLER COMPLETE(TRANSTHOR)  - Jan 31 2014 10:24AM   RESULT: Echocardiogram Report  Patient Name:   SARIA HARAN Date of Exam: 01/31/2014 Medical Rec #:  [id number]            Custom1: Date of Birth:  Oct 13, 1931          Height:       70.0 in Patient Age:    81 years          Weight:       88.0 lb Patient Gender: F                 BSA:          1.47 m??  Indications: Atrial Fib Sonographer:    Sherrie Sport RDCS Referring Phys: Epifanio Lesches  Sonographer Comments: Technically challenging study due to less than   ideal echo windows and suboptimal parasternal window.  Summary:  1. Left ventricular ejection fraction, by visual estimation, is 65 to  70%.  2. Normal global left ventricular systolic function.  3. Impaired relaxation pattern of LV diastolic filling.  4. Severe left ventricular hypertrophy.  5. Decreased left ventricular internal cavity size.  6. Moderately dilated left atrium.  7. Moderately dilated right atrium.  8. Moderate pleural effusion.  9. Mild to moderate mitral valve regurgitation. 10. Moderate aortic valve stenosis. 11. Mild to moderate tricuspid regurgitation. 12. Severely increased left ventricular posterior wall thickness. 2D AND M-MODE MEASUREMENTS (normal ranges within parentheses): Left Ventricle:          Normal IVSd (2D):      1.16 cm (0.7-1.1) LVPWd (2D):     1.67 cm (0.7-1.1) Aorta/LA:                  Normal LVIDd (2D):     2.81 cm (3.4-5.7) Left Atrium (2D): 3.30 cm (1.9-4.0) LVIDs (2D):     2.53 cm LV FS (  2D):     10.0 %   (>25%) LV EF (2D):     22.9 %   (>50%)   Right Ventricle:                                   RVd (2D): LV DIASTOLIC FUNCTION: MV Peak E: 1.06 m/s Decel Time: 134 msec SPECTRAL DOPPLER ANALYSIS (where applicable): Mitral Valve: MV P1/2 Time: 38.86 msec MV Area, PHT: 5.66 cm?? Aortic Valve: AoV Max Vel: 2.81 m/s AoV Peak PG: 31.7 mmHg AoV Mean PG:  20.0 mmHg LVOT Vmax: 0.88 m/s LVOT VTI: 0.165 m LVOT Diameter: 2.00 cm AoV Area, Vmax: 0.98 cm?? AoV Area, VTI: 1.07 cm?? AoV Area, Vmn: 0.82 cm?? Tricuspid Valve and PA/RV Systolic Pressure: TR Max Velocity: 1.78 m/s RA  Pressure: 5 mmHg RVSP/PASP: 17.7 mmHg  PHYSICIAN INTERPRETATION: Left Ventricle: The left ventricular internal cavity size was decreased.  LV posterior wall thickness was severely increased. Severe left  ventricular hypertrophy. Global LV systolic function was normal. Left   ventricular ejection fraction, by visual estimation, is 65 to 70%.  Spectral Doppler shows  impaired relaxation pattern of LV diastolic  filling. Right Ventricle: Normal right ventricular size, wall thickness, and  systolic function. RV wall thickness is normal. Left Atrium: The left atrium is moderately dilated. Right Atrium: The right atrium is moderately dilated. Pericardium: There is no evidence of pericardial effusion. There is a  moderate pleural effusion. Mitral Valve: Mild to moderate mitral valve regurgitation is seen. Tricuspid Valve: Mild to moderate tricuspid regurgitation is visualized.  The tricuspid regurgitant velocity is 1.78 m/s, and with an assumed right  atrial pressure of 5 mmHg, the estimated right ventricular systolic  pressure is normal at 17.7 mmHg. Aortic Valve: Moderate aortic stenosis is present. Pulmonic Valve: Structurally normal pulmonic valve, with normal leaflet  excursion. Aorta: The aortic root and ascending aorta are structurally normal, with  no evidence of dilitation.  [place] MD Electronically signed by [id number] Serafina Royals MD Signature Date/Time: 01/31/2014/4:41:59 PM  *** Final ***  IMPRESSION: .    Verified By: Corey Skains  ([facility]), M.D., MD  Routine Chem:  13-Jul-15 04:30   Result Comment - pt placed back on 2L [place]  Result(s) reported on 31 Jan 2014 at 04:41AM.    04:41   Glucose, Serum 92  BUN  19  Creatinine (comp) 1.07  Sodium, Serum 142  Potassium, Serum 4.4  Chloride, Serum 102  CO2, Serum  37  Calcium (Total), Serum  8.2  Anion Gap  3  Osmolality (calc) 285  eGFR (African American)  57  eGFR (Non-African American)  49 (eGFR values <53m/min/1.73 m2 may be an indication of chronic kidney disease (CKD). Calculated eGFR is useful in patients with stable renal function. The eGFR calculation will not be reliable in acutely ill patients when serum creatinine is changing rapidly. It is not useful in  patients on dialysis. The eGFR calculation may not be applicable to patients at the low and high  extremes of body sizes, pregnant women, and vegetarians.)  Routine Hem:  13-Jul-15 04:41   WBC (CBC) 7.8  RBC (CBC) 4.08  Hemoglobin (CBC) 12.3  Hematocrit (CBC) 38.6  Platelet Count (CBC) 258  MCV 95  MCH 30.2  MCHC  31.9  RDW 13.7  Neutrophil % 78.7  Lymphocyte % 13.4  Monocyte % 6.1  Eosinophil % 1.3  Basophil %  0.5  Neutrophil # 6.2  Lymphocyte # 1.0  Monocyte # 0.5  Eosinophil # 0.1  Basophil # 0.0 (Result(s) reported on 31 Jan 2014 at 05:34AM.)   Radiology Results: XRay:    12-Jul-15 13:55, Chest Portable Single View  Chest Portable Single View   REASON FOR EXAM:    Chest pain  COMMENTS:       PROCEDURE: DXR - DXR PORTABLE CHEST SINGLE VIEW  - Jan 30 2014  1:55PM     CLINICAL DATA:  Chest pain and tachycardia.    EXAM:  PORTABLE CHEST - 1 VIEW    COMPARISON:  CT chest 07/29/2012. CT chest, abdomen and pelvis  10/21/2013. PA and lateral chest 07/06/2012.    FINDINGS:  Changes of pulmonary fibrosis are again seen, worse on the right.  There is volume loss in the right chest. There is mild cardiomegaly.  No pneumothorax or pleural effusion. Reverse shoulder arthroplasty  on the left is noted.     IMPRESSION:  No acute abnormality.    Pulmonary fibrosis.    Mild cardiomegaly.      Electronically Signed    By: Inge Rise M.D.    On: 01/30/2014 14:33     Verified By: Ramond Dial, M.D.,    13-Jul-15 13:46, Chest PA and Lateral  Chest PA and Lateral   REASON FOR EXAM:    chf  COMMENTS:       PROCEDURE: DXR - DXR CHEST PA (OR AP) AND LATERAL  - Jan 31 2014  1:46PM     CLINICAL DATA:  CHF    EXAM:  CHEST  2 VIEW    COMPARISON:  01/30/2014    FINDINGS:  Extensive pulmonary fibrosis is without change from the previous  day's study. Increased opacity at the right base is suggested  although this may be due to more rotation of the patient to the  right. A superimposed inflammatory or infectious infiltrate is  possible. Volume loss  on the right is stable. There is stable mild  cardiomegaly. No pneumothorax. Bony thorax is demineralized. There  are arthropathic changes of the right shoulder and a stable  prosthesis on the left.     IMPRESSION:  Possible right basilar infiltrate superimposed on extensive  interstitial fibrosis. No convincing congestive heart failure  although mild interstitial edema superimposed on the extensive  chronic interstitial changes may not be apparent radiographically.      Electronically Signed    By: Lajean Manes M.D.    On: 01/31/2014 14:15         Verified By: Lasandra Beech, M.D.,  Cardiology:    12-Jul-15 13:32, ED ECG  Ventricular Rate 90  Atrial Rate 122  QRS Duration 78  QT 328  QTc 401  R Axis 54  T Axis 57  ECG interpretation   Atrial fibrillation  Voltage criteria for left ventricular hypertrophy  Abnormal ECG  When compared with ECG of 28-Jan-2014 10:40,  QRS duration has decreased  ST no longer depressed in Lateral leads  QT has shortened  ----------unconfirmed----------  Confirmed by OVERREAD, NOT ([id number]), editor PEARSON, BARBARA ([id number]) on 01/31/2014 1:30:36 PM  ED ECG     13-Jul-15 10:24, Echo Doppler  Echo Doppler   REASON FOR EXAM:      COMMENTS:       PROCEDURE: [facility] - ECHO DOPPLER COMPLETE(TRANSTHOR)  - Jan 31 2014 10:24AM     RESULT: Echocardiogram Report    Patient Name:   ALEKSIS  Tamala Bari Date of Exam: 01/31/2014  Medical Rec #:  [id number]            Custom1:  Date of Birth:  06-02-1932          Height:       70.0 in  Patient Age:    74 years          Weight:       88.0 lb  Patient Gender: F                 BSA:          1.47 m??    Indications: Atrial Fib  Sonographer:    Sherrie Sport RDCS  Referring Phys: Epifanio Lesches    Sonographer Comments: Technically challenging study due to less than   ideal echo windows and suboptimal parasternal window.    Summary:   1. Left ventricular ejection fraction, by visual estimation, is 65 to   70%.    2. Normal global left ventricular systolic function.   3. Impaired relaxation pattern of LV diastolic filling.   4. Severe left ventricular hypertrophy.   5. Decreased left ventricular internal cavity size.   6. Moderately dilated left atrium.   7. Moderately dilated right atrium.   8. Moderate pleural effusion.   9. Mild to moderate mitral valve regurgitation.  10. Moderate aortic valve stenosis.  11. Mild to moderate tricuspid regurgitation.  12. Severely increased left ventricular posterior wall thickness.  2D AND M-MODE MEASUREMENTS (normal ranges within parentheses):  Left Ventricle:          Normal  IVSd (2D):      1.16 cm (0.7-1.1)  LVPWd (2D):     1.67 cm (0.7-1.1) Aorta/LA:                  Normal  LVIDd (2D):     2.81 cm (3.4-5.7) Left Atrium (2D): 3.30 cm (1.9-4.0)  LVIDs (2D):     2.53 cm  LV FS (2D):     10.0 %   (>25%)  LV EF (2D):     22.9 %   (>50%)   Right Ventricle:                                    RVd (2D):  LV DIASTOLIC FUNCTION:  MV Peak E: 1.06 m/s Decel Time: 134 msec  SPECTRAL DOPPLER ANALYSIS (where applicable):  Mitral Valve:  MV P1/2 Time: 38.86 msec  MV Area, PHT: 5.66 cm??  Aortic Valve: AoV Max Vel: 2.81 m/s AoV Peak PG: 31.7 mmHg AoV Mean PG:   20.0 mmHg  LVOT Vmax: 0.88 m/s LVOT VTI: 0.165 m LVOT Diameter: 2.00 cm  AoV Area, Vmax: 0.98 cm?? AoV Area, VTI: 1.07 cm?? AoV Area, Vmn: 0.82 cm??  Tricuspid Valve and PA/RV Systolic Pressure: TR Max Velocity: 1.78 m/s RA   Pressure: 5 mmHg RVSP/PASP: 17.7 mmHg    PHYSICIAN INTERPRETATION:  Left Ventricle: The left ventricular internal cavity size was decreased.   LV posterior wall thickness was severely increased. Severe left   ventricular hypertrophy. Global LV systolic function was normal. Left     ventricular ejection fraction, by visual estimation, is 65 to 70%.   Spectral Doppler shows impaired relaxation pattern of LV diastolic   filling.  Right Ventricle: Normal right ventricular size, wall  thickness, and   systolic function. RV wall thickness is normal.  Left  Atrium: The left atrium is moderately dilated.  Right Atrium: The right atrium is moderately dilated.  Pericardium: There is no evidence of pericardial effusion. There is a   moderate pleural effusion.  Mitral Valve: Mild to moderate mitral valve regurgitation is seen.  Tricuspid Valve: Mild to moderate tricuspid regurgitation is visualized.   The tricuspid regurgitant velocity is 1.78 m/s, and with an assumed right   atrial pressure of 5 mmHg, the estimated right ventricular systolic   pressure is normal at 17.7 mmHg.  Aortic Valve: Moderate aortic stenosis is present.  Pulmonic Valve: Structurally normal pulmonic valve, with normal leaflet   excursion.  Aorta: The aortic root and ascending aorta are structurally normal, with   no evidence of dilitation.    [id number] Serafina Royals MD  Electronically signed by [id number] Serafina Royals MD  Signature Date/Time: 01/31/2014/4:41:59 PM    *** Final ***    IMPRESSION: .        Verified By: Corey Skains  ([facility]), M.D., MD   Assessment/Plan:  Assessment/Plan:  Assessment IMP AFIB Confusion CHF Dementia HTN COPD Hyperlipidemia .   Plan PLAN Tele I do not rec anticoug Inhalers Statin Rate control Bp control Agree with paliative care Conservatitive cardiac input   Electronic Signatures: Lujean Amel D (MD)  (Signed 14-Jul-15 18:11)  Authored: Chief Complaint, VITAL SIGNS/ANCILLARY NOTES, Brief Assessment, Lab Results, Radiology Results, Assessment/Plan   Last Updated: 14-Jul-15 18:11 by Yolonda Kida (MD)

## 2014-11-12 NOTE — Consult Note (Signed)
PATIENT NAME:  Katelyn Schwartz, Katelyn Schwartz MR#:  [id number] DATE OF BIRTH:  September 10, 1931  DATE OF CONSULTATION:  01/31/2014  REFERRING PHYSICIAN:   Aletha HalimJeff Sparks, MD of [facility]. CONSULTING PHYSICIAN:  Dwayne D. Juliann Paresallwood, MD  [facility]   INDICATION FOR CONSULTATION: Rapid atrial fibrillation, new onset.   HISTORY OF PRESENT ILLNESS:  The patient is an 81 year old white female [facility] Patient has a significant history of dementia, peripheral vascular disease, osteoarthritis, hyperlipidemia.  Also, patient has lung disease which is not clear. The patient [facility], had no improvement on nitroglycerin, brought to Emergency Room, and was found to be in rapid atrial fibrillation, mildly hypotensive. Was given IV diltiazem to improve her heart rate. She became chest pain-free. Chest x-ray showed mild congestive heart failure. Troponin was normal.  She was admitted for further evaluation and care. Denies any prior cardiac history.   PAST MEDICAL HISTORY: Alzheimer's, benign hypertension, stress urinary incontinence, protein/calorie malnutrition, chronic anxiety, chronic insomnia, COPD.   MEDICATIONS: Restoril 15 mg at bedtime, oxybutynin ER 10 mg daily, Aricept 10 mg at bedtime, oxygen 2 liters nasal cannula for hypoxemia.   ALLERGIES: BEXTRA, CODEINE, MORPHINE, PENICILLIN, SULFA.  SOCIAL HISTORY: No smoking or alcohol consumption. Lives in a nursing home. Retired.   FAMILY HISTORY: Unobtainable.  REVIEW OF SYSTEMS:  Unobtainable from the patient because of dementia.   PHYSICAL EXAMINATION:  VITAL SIGNS: Blood pressure 140/70, heart rate now is 90 and regular, respiratory rate 16, afebrile.  HEENT: Normocephalic, atraumatic. Pupils equal and reactive to light.   NECK: Supple. No significant JVD, bruits or adenopathy.  LUNGS: Sounds clear with bilateral rhonchi. Decreased breath sounds. No rales. No wheezing.  HEART:  Regular. Systolic ejection murmur along the left sternal border and apex. Positive S4. PMI nondisplaced.  ABDOMEN: Benign.  EXTREMITY: Within normal limits.  NEUROLOGIC: Intact.  SKIN: Normal. The patient appears to be confused, lethargic but arousable.   DIAGNOSTIC DATA:  EKG: Atrial fibrillation initially at 90. Subsequent EKG: Sinus rhythm. Chest x-ray revealed mild pulmonary fibrosis and possible congestion.   LABORATORY DATA: BNP 12,900, glucose 100, BUN of 20, creatinine 0.98, GFR 54, magnesium 1.8, TSH wnl .  Troponin 0.04. White count 8.8, hemoglobin of 12.   ASSESSMENT: Rapid atrial fibrillation, new onset; mild congestive heart failure; chest pain, unclear etiology; Alzheimer's; peripheral vascular disease; cardiac murmur; COPD; hypoxemia.   PLAN:  Admit the patient rule out for myocardial infarction. Followup cardiac enzymes. Followup EKG. Followup troponins.  Consider echocardiogram for further assessment. Do not recommend cardiac catheterization at this stage. Continue telemetry.  Recommend Cardizem as necessary for rate control. Do not recommend anticoagulation at this point. Would recommend continued oxygen therapy. Recommend inhalers as necessary. Do not recommend antibiotics. Short-term anticoagulation with DVT prophylaxis will be helpful. I do not recommend long-term anticoagulation in this instance. Continue medicines for stress incontinence.  Continue medicines for insomnia. Low-dose aspirin may be helpful. Continue to follow the patient. Conservative cardiac care at this stage.    ____________________________ Bobbie Stackwayne D. Juliann Paresallwood, MD ddc:dd D: 01/31/2014 13:06:18 ET T: 01/31/2014 17:50:16 ET JOB#: [id number]  cc: Dwayne D. Juliann Paresallwood, MD, <Dictator> Alwyn PeaWAYNE D CALLWOOD MD ELECTRONICALLY SIGNED 03/07/2014 10:21

## 2014-11-12 NOTE — Consult Note (Signed)
PATIENT NAME:  Katelyn Schwartz, Katelyn Schwartz MR#:  [id number] DATE OF BIRTH:  01/14/1932  DATE OF CONSULTATION:  02/03/2014  REFERRING PHYSICIAN:  Dr. Ramonita Lab CONSULTING PHYSICIAN: Dow Adolph, MD / Hardie Shackleton. Ivey Nembhard, PA-C  REASON FOR CONSULTATION: Possible lower GI bleed.   HISTORY OF PRESENT ILLNESS: This is a pleasant 81 year old female who was recently an inpatient from July 12th to the 15th and readmitted today for worsening altered mental status. This patient is established with me as an outpatient at [facility] for which we have previously seen her for failure to thrive. A CT scan a few months back, including chest, abdomen and pelvis was unrevealing outside of a pancreatic cyst requiring surveillance. Her weight was staying stable at that time. She presents today with a decline in hemoglobin. Currently, her hemoglobin is 9.9 and on July 13th it was 12.3. The patient is a very poor historian and I am unable to get a clear answer from her on whether or not she has seen any overt GI bleeding. I asked the patient's nurse who also reports that she has not had any bleeding yet today that she has witnessed. Per reports it looks like she may have had some rectal bleeding at her nursing home. The patient is denying any abdominal pain. There is no diarrhea or constipation. The remainder of the history is very difficult to obtain from this patient due to her altered mental status. She did have mildly elevated troponins and elevated kidney function as well. Because of the patient's deteriorating health, they were supposed to be meeting with hospice tomorrow and therefore they have had a palliative care consult while inpatient today. They will be meeting with the patient's sons and daughter this afternoon for discussion on her plan of care.   PAST MEDICAL HISTORY: Emphysema, dementia, atrial fibrillation, hypertension, urinary incontinence, malnutrition, failure to thrive, anxiety, insomnia, COPD.  PAST  SURGICAL HISTORY: Hysterectomy and bilateral knee surgery.   SOCIAL HISTORY: The patient denies any alcohol, tobacco or illicit drug use. She resides in a nursing home.   FAMILY HISTORY: There is no known family history of GI malignancy, colon polyps or IBD. Her 2 sons and daughter are very involved in the patient's healthcare.   HOME MEDICATIONS: Temazepam, oxybutynin, olanzapine, levofloxacin, Ensure, enalapril, aspirin.   ALLERGIES: CODEINE, MORPHINE, DEXTRA, PENICILLIN AND SULFA.   REVIEW OF SYSTEMS: A 10 system review was obtained on the patient. Pertinent positives are mentioned above and otherwise negative.   PHYSICAL EXAMINATION: VITAL SIGNS: Blood pressure 166/72, heart rate 80, respirations 18, temp 97.9, bedside pulse ox 90%.  GENERAL: This is a pleasant 81 year old female resting quietly and comfortably in bed in no acute distress. She is alert but unable to tell if she is oriented.  HEAD: Atraumatic, normocephalic.  NECK: Supple. No lymphadenopathy noted.  HENT: Sclerae anicteric. Mucous membranes moist. The patient's speech is slurred because she is missing her lower dentures.  HEART: Regular rate and rhythm. S1 and S2 noted.  LUNGS: Respirations are even and unlabored. Clear to auscultation bilateral anterior lung fields.  ABDOMEN: Soft, nontender, nondistended. Normoactive bowel sounds noted in all 4 quadrants. No guarding or rebound. No masses, hernias or organomegaly appreciated.  PSYCHIATRIC: Appropriate mood and affect, although her sharpness and mental status does seem to be altered. She does say random things throughout our discussion that are not appropriate or do not necessarily make sense.  RECTAL: Deferred.   DIAGNOSTIC DATA: White blood cells 9.7, hemoglobin 9.9, hematocrit 32.7,  platelets 180,000. Sodium 144, potassium 4.4, BUN 31, creatinine 1.73, glucose 82. Troponins 0.12 down from 0.16. CPK 2.9. Lipid panel is within normal limits. MCV 94. Prior hemoglobin on  July 13th of 2015 was 12.3.   Imaging: Chest x-ray was obtained on the patient showing underlying emphysema changes. Superimposed pneumonia could not be excluded.   CT scan of the head without contrast was negative for any acute changes.   ASSESSMENT: 1.  Possible lower gastrointestinal bleed with a drop in hemoglobin from 3 days ago, from 12.3 to 9.9.  2.  Altered mental status.  3.  Elevated troponins, questionable for a non-ST-segment elevation myocardial infarction versus demand ischemia; this is currently being evaluated.  4.  Elevated kidney function. Likely secondary to poor p.o. intake and failure to thrive.  5.  History of multiple comorbidities including atrial fibrillation, chronic obstructive pulmonary disease and dementia.   PLAN: I have discussed this patient's case in detail with Dr. Dow AdolphMatthew Rein who is involved in the development of the patient's plan of care. At this time, we do recommend keeping a close monitor of her hemoglobin, checking these serially and being prepared to transfuse as necessary. If there are any signs of active bleeding, we would recommend proceeding with a tagged red blood cell scan to try and identify the location/source. Certainly, in this patient, proceeding with any sort of endoscopic intervention would be high risk with her multiple comorbidities. She and her family are also meeting with hospice later on this afternoon to make some decisions on her care. We will await this evaluation and continue to monitor her vitals and her hemoglobin closely and make further recommendations per clinical course. All questions were answered. Of note, the patient is DNR status currently.   Thank you so much for this consultation and for allowing us to participate in the patient's plan of care.   This services provided by Hardie ShackletonKaryn M. Kechia Yahnke, PA-C under collaborative agreement with Dr. Dow AdolphMatthew Rein.   ____________________________ Hardie ShackletonKaryn M. Kashmere Staffa, PA-C kme:sb D: 02/03/2014  13:23:40 ET T: 02/03/2014 13:43:05 ET JOB#: [id number]  cc: Hardie ShackletonKaryn M. Mozes Sagar, PA-C, <Dictator> Hardie ShackletonKARYN M Marlana Mckowen PA ELECTRONICALLY SIGNED 02/03/2014 16:27

## 2014-11-12 NOTE — Consult Note (Signed)
   Present Illness Patient is an 81 year old female who was readmitted after recent admission to [facility] with pneumonia.  She was discharged yesterday but return to the hospital with complaints of altered mental status which was worse than it was when she was discharged.  Upon arrival at [facility], she was confused.  Her serum troponin was mildly elevated at 0.12.  Her renal function was mildly decreased.  She was in atrial fibrillation with rapid ventricular response.  Patient is not able to give a history.   she is currently resting fairly comfortably with no complaints.   Physical Exam:  GEN thin, critically ill appearing   NECK No masses   RESP rhonchi   CARD Irregular rate and rhythm  Tachycardic   ABD denies tenderness  no hernia  no Abdominal Bruits   LYMPH negative neck, negative axillae   EXTR negative cyanosis/clubbing, negative edema   SKIN skin turgor poor   PSYCH lethargic   Review of Systems:  Subjective/Chief Complaint 81 year old female  who is lethargic and not able to give any history.   ROS Pt not able to provide ROS   Medications/Allergies Reviewed Medications/Allergies reviewed   Family & Social History:  Family and Social History:  Family History Non-Contributory   Social History negative tobacco   Place of Living Nursing Home   EKG:  Abnormal NSSTTW changes   Interpretation atrial fibrillation with rapid ventricular response    Codeine: Rash  Penicillin: Bradycardia  Sulfa drugs: Rash  Morphine: Bradycardia  Bextra: Unknown   Impression the [age] yearold female who was recently admitted to the hospital with pneumonia.  She was discharged to a skilled nursing facility.  She was noted be taking poor p.o. intake and to be more confused than she was at discharge by her daughter and she was brought back to the emergency room.  She is now admitted for comfort measures.  She had a mildly elevated serum troponin 0.12.  Her renal function is slightly worse than  previously.  She remains in atrial fibrillation with variable but someone rapid ventricular response.  She is not able to give a history.  Her elevated serum troponin appears to be demand ischemia secondary to her heart rate and renal function.  She is not a candidate for chronic anticoagulation or invasive or noninvasive ischemic workup.  Would continue to carefully control rate with metoprolol as you were doing.  Would agree with pursuing the comfort measures and palliative care.   Plan 1. Continue to control weight with intermittent use of beta blockers 2. careful hydration 3. Would not proceed with chronic anticoagulation or invasive cardiac evaluation has her elevated troponin appears to be secondary to demand ischemia 4. I agree with comfort measures.   Electronic Signatures: Dalia HeadingFath, Alexiana Laverdure A (MD)  (Signed 16-Jul-15 16:55)  Authored: General Aspect/Present Illness, History and Physical Exam, Review of System, Family & Social History, EKG , Allergies, Impression/Plan   Last Updated: 16-Jul-15 16:55 by Dalia HeadingFath, Berma Harts A (MD)

## 2014-11-12 NOTE — Discharge Summary (Signed)
PATIENT NAME:  Katelyn Schwartz, Katelyn Schwartz MR#:  [id number] DATE OF BIRTH:  08-01-1931  DATE OF ADMISSION: 01/30/2014 DATE OF DISCHARGE:  02/02/2014   ADMITTING DIAGNOSIS: Atrial fibrillation, rapid ventricular response.  DISCHARGE DIAGNOSES: 1. Atrial fibrillation, rapid ventricular response.  2. Hypoxia with severe emphysema on CT of chest, with questionable pneumonia.  3. Suspected chronic respiratory failure. 4. Hypotension with medications for atrial fibrillation.  5. Chest pain on arrival, not acute coronary syndrome, per cardiologist. No further workup indicated. Echocardiogram with moderate aortic stenosis, moderate mitral regurgitation as well as moderate tricuspid regurgitation and severe left ventricular hypertrophy. 6. Dementia with agitation.  7. Failure to thrive, adult.  8. Severe malnutrition with a BMI of 14.5.  9. History of chronic obstructive pulmonary disease.  10. Chronic diastolic congestive heart failure.  11. Stress urinary incontinence. 12. Hypertension.  13. Anxiety.  14. Insomnia.  15. Alzheimer's dementia.   DISCHARGE CONDITION: Stable.   DISCHARGE MEDICATIONS: The patient is to continue:  1. Temazepam 15 mg p.o. at bedtime.  2. Oxybutynin extended-release 10 mg p.o. daily.  3. Donepezil 10 mg p.o. daily at bedtime. 4. Ensure Plus 237 mL 3 times daily with meals.  5. Aspirin 81 mg p.o. daily.  6. Olanzapine 2.5 mg p.o. twice daily.  7. Levofloxacin 750 mg every 48 hours for 5 more days, which would be 2 pills.   DISCHARGE CONDITION: Guarded.   HOME OXYGEN: With portable tank at 2 liters of oxygen through nasal cannula as needed.   DIET: Regular. Dietary supplement: Ensure Plus 3 times a day. Diet consistency: Mechanical soft.   ACTIVITY LIMITATIONS: As tolerated.   REFERRAL: To hospice at the facility.    FOLLOWUP APPOINTMENT: With skilled nursing facility MD in the next 2 or 3 days after discharge.  CONSULTANTS: Care management, social work, Dr.  Juliann Paresallwood.   RADIOLOGIC STUDIES:  Chest x-ray, portable, single view, 01/30/2014, revealed no acute abnormality. Pulmonary fibrosis, mild cardiomegaly.  Repeated chest x-ray, PA and lateral, 01/31/2014, revealed possible right basilar infiltrate superimposed on extensive interstitial fibrosis. No convincing congestive heart failure, although mild interstitial edema superimposed on extensive chronic interstitial changes may not be apparent radiographically.  CT scan of chest with IV contrast to rule out pulmonary embolism, 01/31/2014, revealed no demonstrable pulmonary embolus, widespread bullous disease. There appeared to be congestive heart failure superimposed on interstitial fibrosis and emphysema. There is patchy atelectasis change in the lung bases, superimposed pneumonia in these areas could not be excluded. There is left ventricular hypertrophy. There is fatty liver.  Echocardiogram, 01/31/2014, showed left ventricular ejection fraction by visual estimation 65% to 70%, normal global left ventricular systolic function, impaired relaxation pattern of left ventricular diastolic filling, severe left ventricular hypertrophy, decreased left ventricular internal cavity size, moderately dilated right atrium as well as left atrium, moderate pleural effusion, mild to moderate mitral valve regurgitation, moderate aortic valve stenosis, mild to moderate tricuspid regurgitation, severely increased left ventricular posterior wall thickness.   HOSPITAL COURSE: The patient is an 81 year old Caucasian female with history of Alzheimer's dementia as well as COPD, who presented to the hospital with complaints of atrial fibrillation and chest pains. Please refer to Dr. Judithann SheenSparks' admission note on 01/30/2014. Apparently, the [facility] and had no improvement with nitroglycerin sublingually. She was brought to the Emergency Room and was found to be in atrial fibrillation, RVR and  hypotensive. She was given diltiazem bolus x1, with improvement of her heart rate and symptoms. On  arrival to the hospital, blood pressure was somewhat low, then improved to 140/70, respiration rate was 20, temperature was 97.4. O2 saturations were 95% on 2 liters of oxygen through nasal cannula. Physical exam was unremarkable except for cardiac murmur.   The patient's lab data done on arrival to the hospital showed elevation of beta-type natriuretic peptide of 12,995. Glucose was 100, BUN of 20, otherwise BMP was unremarkable. CO2 level was elevated at 36. Liver enzymes showed albumin level of 2.7, otherwise unremarkable. Cardiac enzymes x3 were within normal limits. CBC: White blood cell count was 8.8, hemoglobin was 12.1, platelet count 294. The patient had urinalysis done which showed 5 red blood cells, 1 white blood cell, otherwise unremarkable study. O2 saturations were 88% on 21% FiO2. EKG initially shows atrial fibrillation, rate of 90, voltage criteria for hypertrophy, abnormal EKG.   The patient was admitted to the hospital for further evaluation with diagnosis of atrial fibrillation, RVR, chest pain. She was initiated on Cardizem p.o.; however, her blood pressure did not tolerate, and her blood pressure was very low. However, she was able to convert to sinus rhythm, and while in the hospital, she was intermittently converting into sinus rhythm; however, intermittently, she would stay in atrial fibrillation, but rate was controlled. She underwent CT scan of her chest due to concerns of possible pulmonary embolism; however, no pulmonary embolism was found. However, there was a concern of possible congestive heart failure as well as possible pneumonia. The patient was initiated on oral Levaquin for possible pneumonia; however, no sputum cultures were obtained. It is recommended to continue antibiotic therapy for 5 more days to complete course.   In regards to atrial fibrillation, RVR as well as  congestive heart failure, it was felt that the patient's atrial fibrillation, RVR was responsible for acute on chronic diastolic congestive heart failure exacerbation. Unfortunately, we were not able to provide any diuresis; however, the patient's heart rate slowed down with conservative therapy, and her condition somewhat improved. Her O2 saturations, however, remained 96% on 2 liters of oxygen through nasal cannula. It was felt that the patient very likely has chronic respiratory failure due to severe bullous emphysema which was noted on chest CT.   In regards to chest pains, it was felt that the patient's chest pain could have been related to atrial fibrillation, RVR; however, since the patient's cardiac enzymes did not worsen, cardiologist, Dr. Juliann Pares, who followed the patient along, did not feel that any kind of further investigation needs to be done. Echocardiogram was performed, which showed moderate aortic stenosis as well as moderate MR and TR and severe LVH.   Because of her underlying medical problems, palliative care was consulted, who recommended hospice in the facility if her condition deteriorates, especially due to her failure to thrive and severe protein calorie malnutrition with BMI of 14.5. The patient's family was agreeable to change her code to DNR code. We recommend hospice care in the facility.   In regards to chronic medical problems, such as stress urinary incontinence, anxiety, insomnia, Alzheimer's dementia, the patient is to continue her outpatient medications.   For agitation, the patient was initiated on Zyprexa, with some improvement of her condition overall. We recommend to advance the patient's Zyprexa as needed to control her behavior.   On the day of discharge, the patient was resting, did not complain of any significant discomfort. She was afebrile with temperature of 97.8, pulse was from 50s to 67, respirations were 14 to 20, blood pressure 98/44,  saturation was 96%  on 2 liters of oxygen through nasal cannula at rest.   TIME SPENT: 40 minutes.   ____________________________ Katharina Caper, MD rv:lb D: 02/02/2014 09:07:33 ET T: 02/02/2014 09:31:57 ET JOB#: [id number]  cc: Katharina Caper, MD, <Dictator> [facility] Giavanni Zeitlin MD ELECTRONICALLY SIGNED 02/15/2014 4:56

## 2015-08-20 IMAGING — CR DG CHEST 2V
1 series · 2 of 2 positions shown · non-contrast
Comparison: 01/30/2014

CLINICAL DATA: CHF

EXAM:
CHEST  2 VIEW

[Series 1: x chest ap · 0.14mm/px · 2 of 2 slices shown]
[im 1/2]
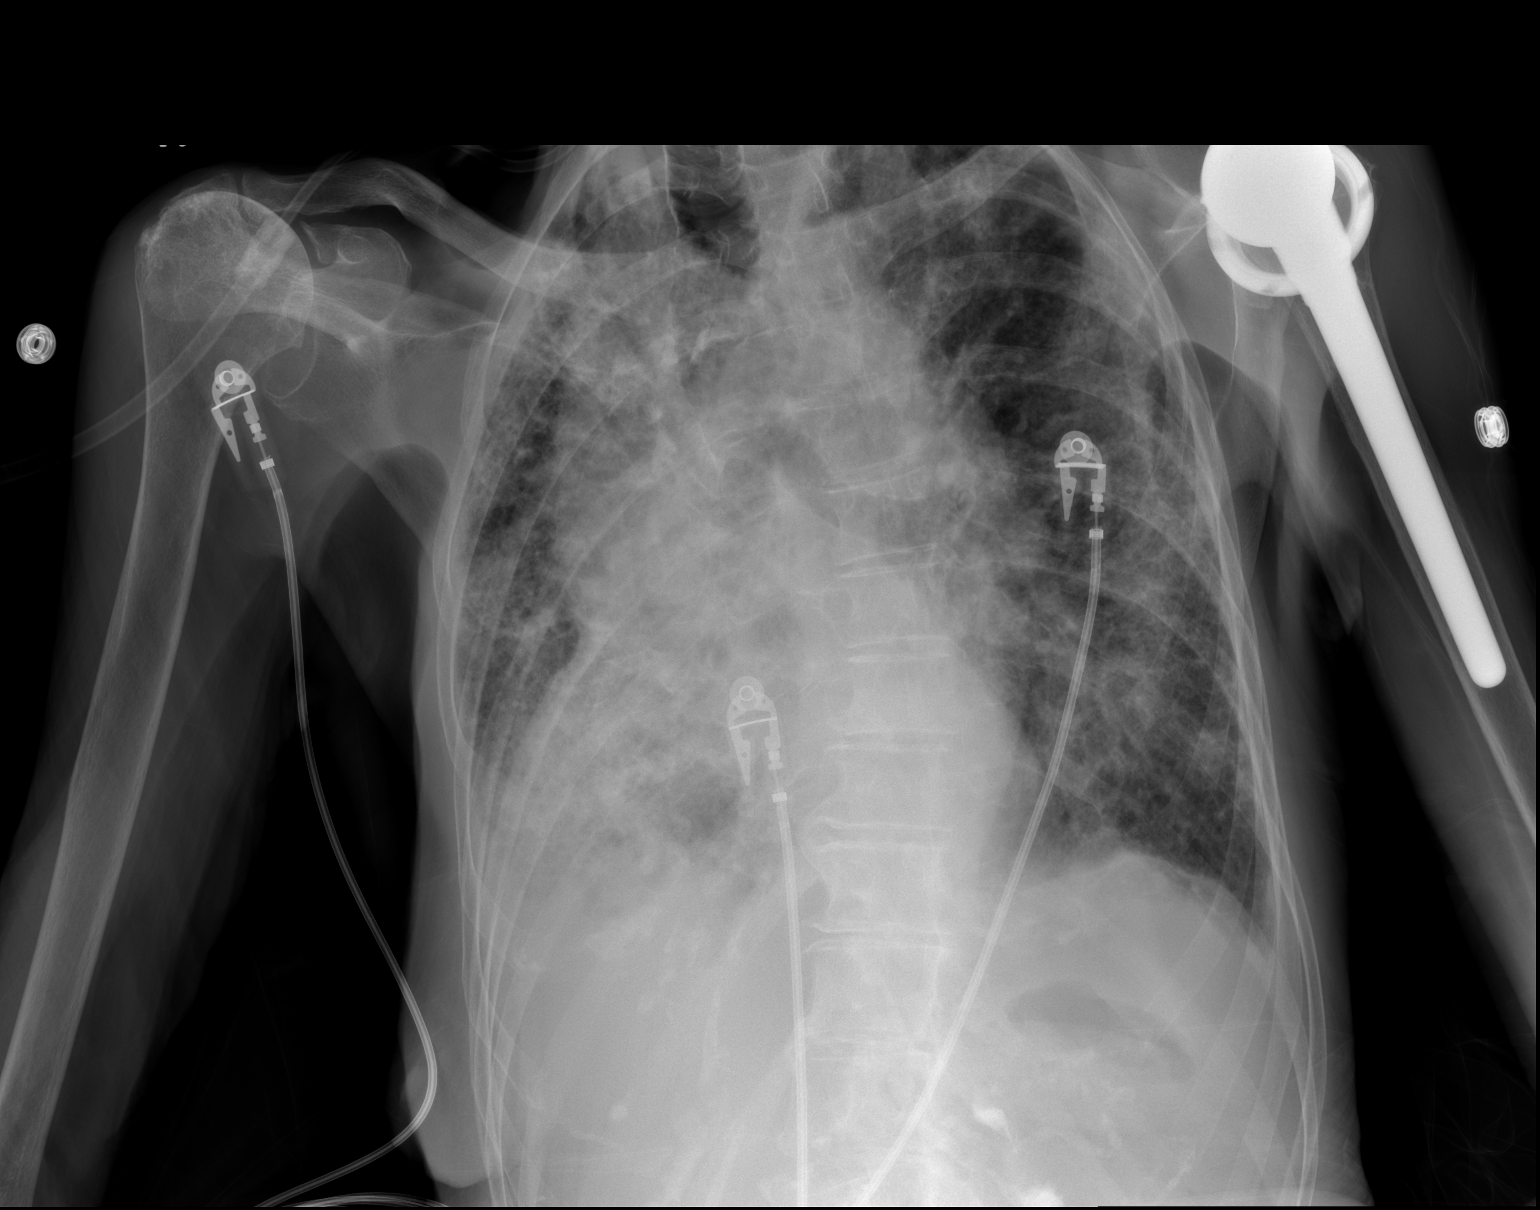
[im 2/2]
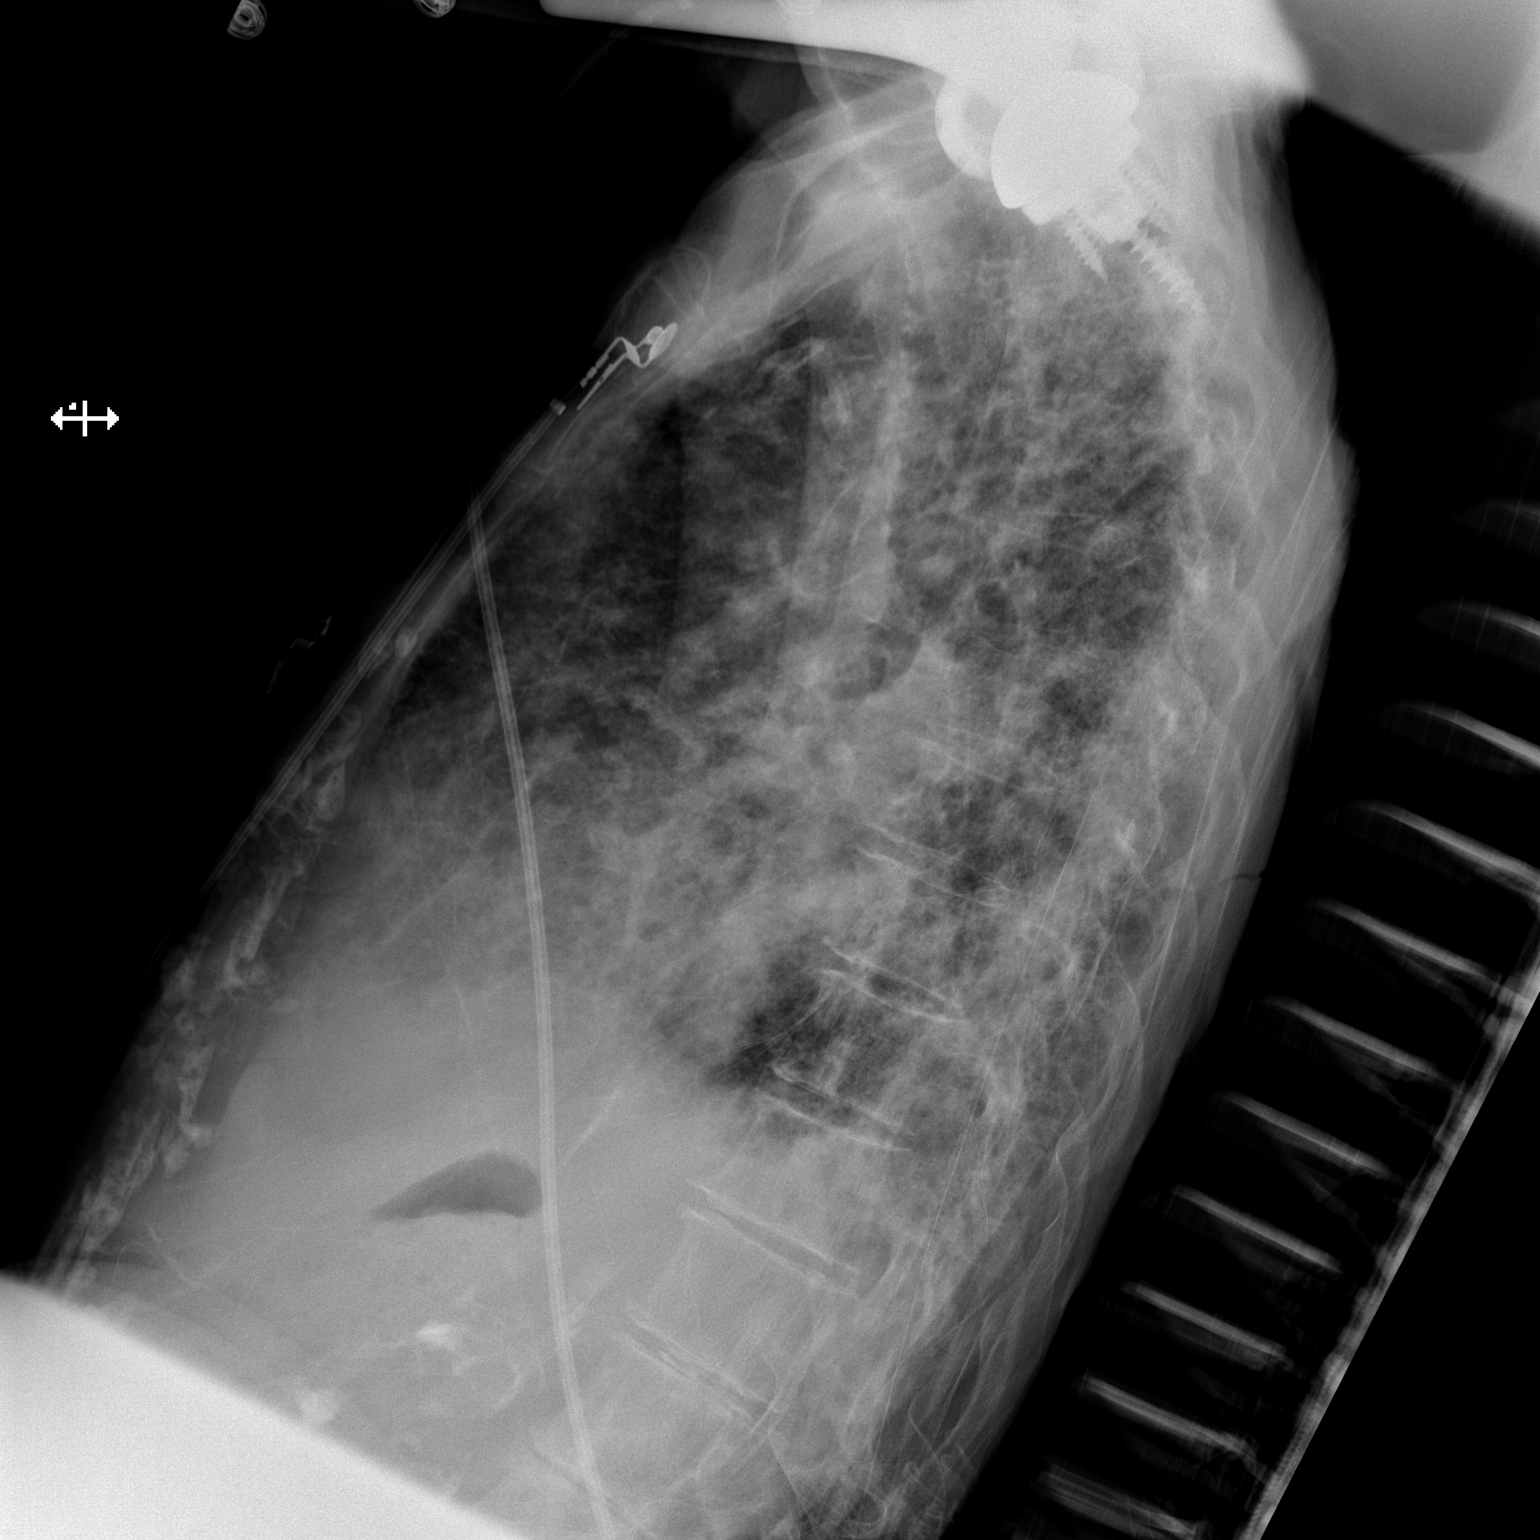

[2 of 2 positions shown; findings below may reference images not displayed]

FINDINGS: Extensive pulmonary fibrosis is without change from the previous
day's study. Increased opacity at the right base is suggested
although this may be due to more rotation of the patient to the
right. A superimposed inflammatory or infectious infiltrate is
possible. Volume loss on the right is stable. There is stable mild
cardiomegaly. No pneumothorax. Bony thorax is demineralized. There
are arthropathic changes of the right shoulder and a stable
prosthesis on the left.
IMPRESSION: Possible right basilar infiltrate superimposed on extensive
interstitial fibrosis. No convincing congestive heart failure
although mild interstitial edema superimposed on the extensive
chronic interstitial changes may not be apparent radiographically.
